# Patient Record
Sex: Female | Born: 1985 | Race: White | Hispanic: No | Marital: Single | Smoking: Never smoker
Health system: Southern US, Community
[De-identification: ages and names within clinical notes are randomized; demographics above are authoritative.]

## PROBLEM LIST (undated history)

## (undated) DIAGNOSIS — Z973 Presence of spectacles and contact lenses: Secondary | ICD-10-CM

## (undated) DIAGNOSIS — O1490 Unspecified pre-eclampsia, unspecified trimester: Secondary | ICD-10-CM

## (undated) DIAGNOSIS — O039 Complete or unspecified spontaneous abortion without complication: Secondary | ICD-10-CM

## (undated) DIAGNOSIS — O24419 Gestational diabetes mellitus in pregnancy, unspecified control: Secondary | ICD-10-CM

## (undated) HISTORY — DX: Complete or unspecified spontaneous abortion without complication: O03.9

## (undated) HISTORY — DX: Gestational diabetes mellitus in pregnancy, unspecified control: O24.419

## (undated) HISTORY — DX: Unspecified pre-eclampsia, unspecified trimester: O14.90

## (undated) HISTORY — PX: NO PAST SURGERIES: SHX2092

---

## 2010-02-27 ENCOUNTER — Observation Stay: Payer: Self-pay

## 2010-03-02 ENCOUNTER — Inpatient Hospital Stay: Payer: Self-pay

## 2020-08-28 DIAGNOSIS — L729 Follicular cyst of the skin and subcutaneous tissue, unspecified: Secondary | ICD-10-CM | POA: Diagnosis not present

## 2020-08-28 DIAGNOSIS — L089 Local infection of the skin and subcutaneous tissue, unspecified: Secondary | ICD-10-CM | POA: Diagnosis not present

## 2020-08-28 DIAGNOSIS — R030 Elevated blood-pressure reading, without diagnosis of hypertension: Secondary | ICD-10-CM | POA: Diagnosis not present

## 2020-09-18 DIAGNOSIS — L03221 Cellulitis of neck: Secondary | ICD-10-CM | POA: Diagnosis not present

## 2020-09-18 DIAGNOSIS — L723 Sebaceous cyst: Secondary | ICD-10-CM | POA: Diagnosis not present

## 2020-09-28 DIAGNOSIS — D489 Neoplasm of uncertain behavior, unspecified: Secondary | ICD-10-CM | POA: Diagnosis not present

## 2020-09-30 ENCOUNTER — Encounter: Payer: Self-pay | Admitting: Anesthesiology

## 2020-09-30 ENCOUNTER — Other Ambulatory Visit: Payer: Self-pay

## 2020-09-30 ENCOUNTER — Encounter: Payer: Self-pay | Admitting: Otolaryngology

## 2020-10-01 ENCOUNTER — Ambulatory Visit
Admission: EM | Admit: 2020-10-01 | Discharge: 2020-10-01 | Disposition: A | Discharge status: Home / Self Care | Payer: BC Managed Care – PPO | Attending: Physician Assistant | Admitting: Physician Assistant

## 2020-10-01 ENCOUNTER — Other Ambulatory Visit: Payer: Self-pay

## 2020-10-01 ENCOUNTER — Encounter: Payer: Self-pay | Admitting: Emergency Medicine

## 2020-10-01 DIAGNOSIS — R350 Frequency of micturition: Secondary | ICD-10-CM | POA: Diagnosis not present

## 2020-10-01 DIAGNOSIS — N3001 Acute cystitis with hematuria: Secondary | ICD-10-CM

## 2020-10-01 LAB — URINALYSIS, COMPLETE (UACMP) WITH MICROSCOPIC
Bilirubin Urine: NEGATIVE
Glucose, UA: NEGATIVE mg/dL
Ketones, ur: 5 mg/dL — AB
Nitrite: POSITIVE — AB
Protein, ur: 30 mg/dL — AB
Specific Gravity, Urine: 1.03 (ref 1.005–1.030)
pH: 6.5 (ref 5.0–8.0)

## 2020-10-01 MED ORDER — NITROFURANTOIN MONOHYD MACRO 100 MG PO CAPS: 100.0000 mg | ORAL_CAPSULE | Freq: Two times a day (BID) | ORAL | 0 refills | Status: AC

## 2020-10-01 NOTE — Discharge Instructions (Signed)

## 2020-10-01 NOTE — ED Triage Notes (Signed)
Patient in today c/o urinary frequency and urgency x 1 day. Patient denies fever or vaginal discharge. Patient did just find out that she is pregnant (LMP 09/02/20). Patient has not taken any OTC medications.

## 2020-10-01 NOTE — ED Provider Notes (Signed)
[facility]    CSN: [id number] Arrival date & time: 10/01/20  1113      History   Chief Complaint Chief Complaint  Patient presents with  . Urinary Urgency  . Urinary Frequency    HPI Darlene Moreno is a 34 y.o. female presenting for urinary frequency and urgency over the past day.  She denies any associated dysuria, vaginal discharge, abdominal/pelvic pain, back pain, hematuria, fever.  Patient states that she recently did have a vaginal yeast infection.  States that she took over-the-counter Monistat for symptoms.  Patient states that this came after she was on a lengthy course of doxycycline for a cyst of her neck.  She has an upcoming surgery for the cyst.  Patient states her last menstrual period was 1 month ago.  She recently had a positive pregnancy test this week.  No concern for STIs.  Patient has not taken any over-the-counter medications.  No other concerns today.  HPI  Past Medical History:  Diagnosis Date  . Wears contact lenses     There are no problems to display for this patient.   Past Surgical History:  Procedure Laterality Date  . NO PAST SURGERIES      OB History    Gravida  1   Para      Term      Preterm      AB      Living        SAB      IAB      Ectopic      Multiple      Live Births               Home Medications    Prior to Admission medications   Medication Sig Start Date End Date Taking? Authorizing Provider  nitrofurantoin, macrocrystal-monohydrate, (MACROBID) 100 MG capsule Take 1 capsule (100 mg total) by mouth 2 (two) times daily for 7 days. 10/01/20 10/08/20  Danton Clap, PA-C    Family History Family History  Problem Relation Age of Onset  . Hypertension Mother   . Diabetes Father     Social History Social History   Tobacco Use  . Smoking status: Never Smoker  . Smokeless tobacco: Never Used  Vaping Use  . Vaping Use: Never used  Substance Use Topics  . Alcohol use: Not  Currently  . Drug use: Never     Allergies   Amoxicillin   Review of Systems Review of Systems  Constitutional: Negative for chills and fever.  Gastrointestinal: Negative for abdominal pain, diarrhea, nausea and vomiting.  Genitourinary: Positive for frequency and urgency. Negative for decreased urine volume, dysuria, flank pain, hematuria, pelvic pain, vaginal bleeding, vaginal discharge and vaginal pain.  Musculoskeletal: Negative for back pain.  Skin: Negative for rash.     Physical Exam Triage Vital Signs ED Triage Vitals  Enc Vitals Group     BP 10/01/20 1125 126/85     Pulse Rate 10/01/20 1125 (!) 114     Resp 10/01/20 1125 18     Temp 10/01/20 1125 98.8 F (37.1 C)     Temp Source 10/01/20 1125 Oral     SpO2 10/01/20 1125 99 %     Weight 10/01/20 1126 160 lb (72.6 kg)     Height 10/01/20 1126 5\' 1"  (1.549 m)     Head Circumference --      Peak Flow --      Pain Score 10/01/20 1125 0  Pain Loc --      Pain Edu? --      Excl. in [place]? --    No data found.  Updated Vital Signs BP 126/85 (BP Location: Left Arm)   Pulse (!) 114   Temp 98.8 F (37.1 C) (Oral)   Resp 18   Ht 5\' 1"  (1.549 m)   Wt 160 lb (72.6 kg)   LMP 09/02/2020 (Exact Date)   SpO2 99%   BMI 30.23 kg/m    Physical Exam Vitals and nursing note reviewed.  Constitutional:      General: She is not in acute distress.    Appearance: Normal appearance. She is not ill-appearing or toxic-appearing.  HENT:     Head: Normocephalic and atraumatic.  Eyes:     General: No scleral icterus.       Right eye: No discharge.        Left eye: No discharge.     Conjunctiva/sclera: Conjunctivae normal.  Cardiovascular:     Rate and Rhythm: Regular rhythm. Tachycardia present.     Heart sounds: Normal heart sounds.  Pulmonary:     Effort: Pulmonary effort is normal. No respiratory distress.     Breath sounds: Normal breath sounds.  Abdominal:     Palpations: Abdomen is soft.     Tenderness: There  is no abdominal tenderness. There is no right CVA tenderness or left CVA tenderness.  Musculoskeletal:     Cervical back: Neck supple.  Skin:    General: Skin is dry.  Neurological:     General: No focal deficit present.     Mental Status: She is alert. Mental status is at baseline.     Motor: No weakness.     Gait: Gait normal.  Psychiatric:        Mood and Affect: Mood normal.        Behavior: Behavior normal.        Thought Content: Thought content normal.      UC Treatments / Results  Labs (all labs ordered are listed, but only abnormal results are displayed) Labs Reviewed  URINALYSIS, COMPLETE (UACMP) WITH MICROSCOPIC - Abnormal; Notable for the following components:      Result Value   APPearance HAZY (*)    Hgb urine dipstick SMALL (*)    Ketones, ur 5 (*)    Protein, ur 30 (*)    Nitrite POSITIVE (*)    Leukocytes,Ua TRACE (*)    Bacteria, UA FEW (*)    All other components within normal limits  URINE CULTURE    EKG   Radiology No results found.  Procedures Procedures (including critical care time)  Medications Ordered in UC Medications - No data to display  Initial Impression / Assessment and Plan / UC Course  I have reviewed the triage vital signs and the nursing notes.  Pertinent labs & imaging results that were available during my care of the patient were reviewed by me and considered in my medical decision making (see chart for details).   UA positive for small blood, positive nitrites, and trace leukocytes.  Urine will be sent for culture.  Treating for UTI at this time with Macrobid 7-day course that she is pregnant.  Advised increasing rest and fluids.  Advised supportive care.  Did offer to do a wet prep test for her to see if her yeast infection is resolved, but she declined stating that she does not have any symptoms and is not concerned.  Advised that she  develops a yeast infection to take over-the-counter Monistat.  Advised to follow-up as  needed.   Final Clinical Impressions(s) / UC Diagnoses   Final diagnoses:  Acute cystitis with hematuria  Urinary frequency     Discharge Instructions     UTI: Based on either symptoms or urinalysis, you may have a urinary tract infection. We will send the urine for culture and call with results in a few days. Begin antibiotics at this time. Your symptoms should be much improved over the next 2-3 days. Increase rest and fluid intake. If for some reason symptoms are worsening or not improving after a couple of days or the urine culture determines the antibiotics you are taking will not treat the infection, the antibiotics may be changed. Return or go to ER for fever, back pain, worsening urinary pain, discharge, increased blood in urine. May take Tylenol or Motrin OTC for pain relief or consider AZO if no contraindications     ED Prescriptions    Medication Sig Dispense Auth. Provider   nitrofurantoin, macrocrystal-monohydrate, (MACROBID) 100 MG capsule Take 1 capsule (100 mg total) by mouth 2 (two) times daily for 7 days. 14 capsule Danton Clap, PA-C     PDMP not reviewed this encounter.   Danton Clap, PA-C 10/01/20 1249

## 2020-10-04 LAB — URINE CULTURE: Culture: >=100000 — AB

## 2020-10-05 ENCOUNTER — Other Ambulatory Visit
Admission: RE | Admit: 2020-10-05 | Discharge: 2020-10-05 | Disposition: A | Discharge status: Home / Self Care | Payer: BC Managed Care – PPO | Source: Ambulatory Visit | Attending: Otolaryngology | Admitting: Otolaryngology

## 2020-10-05 ENCOUNTER — Other Ambulatory Visit: Payer: Self-pay

## 2020-10-05 DIAGNOSIS — Z20822 Contact with and (suspected) exposure to covid-19: Secondary | ICD-10-CM | POA: Insufficient documentation

## 2020-10-05 DIAGNOSIS — Z01812 Encounter for preprocedural laboratory examination: Secondary | ICD-10-CM | POA: Insufficient documentation

## 2020-10-05 DIAGNOSIS — Z881 Allergy status to other antibiotic agents status: Secondary | ICD-10-CM | POA: Diagnosis not present

## 2020-10-05 DIAGNOSIS — L720 Epidermal cyst: Secondary | ICD-10-CM | POA: Diagnosis not present

## 2020-10-05 LAB — SARS CORONAVIRUS 2 (TAT 6-24 HRS): SARS Coronavirus 2: NEGATIVE

## 2020-10-06 NOTE — Discharge Instructions (Signed)
General Anesthesia, Adult, Care After This sheet gives you information about how to care for yourself after your procedure. Your health care provider may also give you more specific instructions. If you have problems or questions, contact your health care provider. What can I expect after the procedure? After the procedure, the following side effects are common:  Pain or discomfort at the IV site.  Nausea.  Vomiting.  Sore throat.  Trouble concentrating.  Feeling cold or chills.  Weak or tired.  Sleepiness and fatigue.  Soreness and body aches. These side effects can affect parts of the body that were not involved in surgery. Follow these instructions at home:  For at least 24 hours after the procedure:  Have a responsible adult stay with you. It is important to have someone help care for you until you are awake and alert.  Rest as needed.  Do not: ? Participate in activities in which you could fall or become injured. ? Drive. ? Use heavy machinery. ? Drink alcohol. ? Take sleeping pills or medicines that cause drowsiness. ? Make important decisions or sign legal documents. ? Take care of children on your own. Eating and drinking  Follow any instructions from your health care provider about eating or drinking restrictions.  When you feel hungry, start by eating small amounts of foods that are soft and easy to digest (bland), such as toast. Gradually return to your regular diet.  Drink enough fluid to keep your urine pale yellow.  If you vomit, rehydrate by drinking water, juice, or clear broth. General instructions  If you have sleep apnea, surgery and certain medicines can increase your risk for breathing problems. Follow instructions from your health care provider about wearing your sleep device: ? Anytime you are sleeping, including during daytime naps. ? While taking prescription pain medicines, sleeping medicines, or medicines that make you drowsy.  Return to  your normal activities as told by your health care provider. Ask your health care provider what activities are safe for you.  Take over-the-counter and prescription medicines only as told by your health care provider.  If you smoke, do not smoke without supervision.  Keep all follow-up visits as told by your health care provider. This is important. Contact a health care provider if:  You have nausea or vomiting that does not get better with medicine.  You cannot eat or drink without vomiting.  You have pain that does not get better with medicine.  You are unable to pass urine.  You develop a skin rash.  You have a fever.  You have redness around your IV site that gets worse. Get help right away if:  You have difficulty breathing.  You have chest pain.  You have blood in your urine or stool, or you vomit blood. Summary  After the procedure, it is common to have a sore throat or nausea. It is also common to feel tired.  Have a responsible adult stay with you for the first 24 hours after general anesthesia. It is important to have someone help care for you until you are awake and alert.  When you feel hungry, start by eating small amounts of foods that are soft and easy to digest (bland), such as toast. Gradually return to your regular diet.  Drink enough fluid to keep your urine pale yellow.  Return to your normal activities as told by your health care provider. Ask your health care provider what activities are safe for you. This information is not   intended to replace advice given to you by your health care provider. Make sure you discuss any questions you have with your health care provider. Document Revised: 10/06/2017 Document Reviewed: 05/19/2017 Elsevier Patient Education  2020 Elsevier Inc.  

## 2020-10-07 ENCOUNTER — Encounter
Admission: RE | Disposition: A | Discharge status: Home / Self Care | Payer: Self-pay | Source: Home / Self Care | Attending: Otolaryngology

## 2020-10-07 ENCOUNTER — Other Ambulatory Visit: Payer: Self-pay

## 2020-10-07 ENCOUNTER — Ambulatory Visit
Admission: RE | Admit: 2020-10-07 | Discharge: 2020-10-07 | Disposition: A | Discharge status: Home / Self Care | Payer: BC Managed Care – PPO | Attending: Otolaryngology | Admitting: Otolaryngology

## 2020-10-07 ENCOUNTER — Encounter: Payer: Self-pay | Admitting: Otolaryngology

## 2020-10-07 DIAGNOSIS — Z881 Allergy status to other antibiotic agents status: Secondary | ICD-10-CM | POA: Diagnosis not present

## 2020-10-07 DIAGNOSIS — Z20822 Contact with and (suspected) exposure to covid-19: Secondary | ICD-10-CM | POA: Diagnosis not present

## 2020-10-07 DIAGNOSIS — L720 Epidermal cyst: Secondary | ICD-10-CM | POA: Diagnosis not present

## 2020-10-07 HISTORY — PX: EXCISION MASS NECK: SHX6703

## 2020-10-07 HISTORY — DX: Presence of spectacles and contact lenses: Z97.3

## 2020-10-07 SURGERY — EXCISION, MASS, NECK
Anesthesia: LOCAL | Site: Neck

## 2020-10-07 MED ORDER — LIDOCAINE-EPINEPHRINE 1 %-1:100000 IJ SOLN
INTRAMUSCULAR | Status: DC | PRN
  Administered 2020-10-07: 4.5 mL

## 2020-10-07 MED ORDER — BACITRACIN 500 UNIT/GM EX OINT
TOPICAL_OINTMENT | CUTANEOUS | Status: DC | PRN
  Administered 2020-10-07: 1 via TOPICAL

## 2020-10-07 SURGICAL SUPPLY — 10 items
CORD BIP STRL DISP 12FT (MISCELLANEOUS) ×1 IMPLANT
GLOVE BIO SURGEON STRL SZ7.5 (GLOVE) ×3 IMPLANT
GOWN STRL REUS W/ TWL LRG LVL3 (GOWN DISPOSABLE) ×2 IMPLANT
GOWN STRL REUS W/TWL LRG LVL3 (GOWN DISPOSABLE) ×4
KIT TURNOVER KIT A (KITS) ×2 IMPLANT
NS IRRIG 500ML POUR BTL (IV SOLUTION) ×2 IMPLANT
PACK HEAD/NECK (MISCELLANEOUS) ×2 IMPLANT
SOL PREP PROV IODINE SCRUB 4OZ (MISCELLANEOUS) ×1 IMPLANT
SUT PROLENE 5 0 PS 3 (SUTURE) ×1 IMPLANT
SUT VIC AB 5-0 PC1 18 (SUTURE) ×1 IMPLANT

## 2020-10-07 NOTE — Op Note (Signed)
..  10/07/2020  10:57 AM    Darlene Moreno  [id number]   Pre-Op Dx:  Epidermal Inclusion cyst  Post-op Dx: Epidermal Inclusion cyst  Proc: Right neck mass excision 3 cm  Surg: [facility]  Anes:  Local  EBL:  None  Comp:  None  Findings:  Right anterior neck mass excised consistent with ruptured epidermal inclusion cyst.  Procedure: With the patient in a comfortable supine position, local anesthesia of 85ml of 1% lidocaine with 1:100,000 epinephrine was administered.  The previously marked right neck mass was next prepped and draped in a sterile fashion.  A 15 blade scalpel was used to incision incise the dermis and this reveal erythematous tissue consistent with apparent ruptured epidermal inclusion cyst.  The cyst and surrounding tissue along with the cutaneous tissue was removed overlying the cyst.  At this time, the skin edges were elevated for skin closure and the sub dermis was re approximated with 5.0 Vicryl in an interrupted fashion.  The skin was closed with running prolene and then topped with bacitracin ointment.  Following this  The patient was transferred to recovery in stable condition.  Dispo:  PACU to home  Plan: Ointment TID x 7 days and then follow up in 1 week for suture removal.   Darlene Moreno 10:57 AM 10/07/2020

## 2020-10-07 NOTE — OR Nursing (Signed)
Pt in the room @ 1029  Pulse  98 O2 100 Resp 19 BP 114/77   Procedure end @ 1054  Pulse93 O2 99 Resp  17 BP 113/70    Monitor nurse T. Turner, RN, CNOR

## 2020-10-07 NOTE — H&P (Signed)
..  History and Physical paper copy reviewed and updated date of procedure and will be scanned into system.  Patient seen and examined and marked.  Patient pregnant so will do under local anesthesia.

## 2020-10-12 LAB — SURGICAL PATHOLOGY

## 2020-10-29 ENCOUNTER — Telehealth: Payer: Self-pay

## 2020-10-29 NOTE — Telephone Encounter (Signed)
Voice mail message left for patient- due to rise in [place] cases we have reinstated the no visitor policy.

## 2020-10-30 ENCOUNTER — Ambulatory Visit (INDEPENDENT_AMBULATORY_CARE_PROVIDER_SITE_OTHER): Payer: BC Managed Care – PPO | Admitting: Certified Nurse Midwife

## 2020-10-30 ENCOUNTER — Other Ambulatory Visit: Payer: Self-pay

## 2020-10-30 ENCOUNTER — Encounter: Payer: Self-pay | Admitting: Certified Nurse Midwife

## 2020-10-30 VITALS — BP 127/77 | HR 107 | Ht 61.0 in | Wt 161.1 lb

## 2020-10-30 DIAGNOSIS — N912 Amenorrhea, unspecified: Secondary | ICD-10-CM

## 2020-10-30 DIAGNOSIS — Z3201 Encounter for pregnancy test, result positive: Secondary | ICD-10-CM

## 2020-10-30 NOTE — Patient Instructions (Addendum)
WHAT OB PATIENTS CAN EXPECT   Confirmation of pregnancy and ultrasound ordered if medically indicated-[redacted] weeks gestation  New OB (NOB) intake with nurse and New OB (NOB) labs- [redacted] weeks gestation  New OB (NOB) physical examination with provider- 11/[redacted] weeks gestation  Flu vaccine-[redacted] weeks gestation  Anatomy scan-[redacted] weeks gestation  Glucose tolerance test, blood work to test for anemia, T-dap vaccine-[redacted] weeks gestation  Vaginal swabs/cultures-STD/Group B strep-[redacted] weeks gestation  Appointments every 4 weeks until 28 weeks  Every 2 weeks from 28 weeks until 36 weeks  Weekly visits from 36 weeks until delivery    Common Medications Safe in Pregnancy  Acne:      Constipation:  Benzoyl Peroxide     Colace  Clindamycin      Dulcolax Suppository  Topica Erythromycin     Fibercon  Salicylic Acid      Metamucil         Miralax AVOID:        Senakot   Accutane    Cough:  Retin-A       Cough Drops  Tetracycline      Phenergan w/ Codeine if Rx  Minocycline      Robitussin (Plain & DM)  Antibiotics:     Crabs/Lice:  Ceclor       RID  Cephalosporins    AVOID:  E-Mycins      Kwell  Keflex  Macrobid/Macrodantin   Diarrhea:  Penicillin      Kao-Pectate  Zithromax      Imodium AD         PUSH FLUIDS AVOID:       Cipro     Fever:  Tetracycline      Tylenol (Regular or Extra  Minocycline       Strength)  Levaquin      Extra Strength-Do not          Exceed 8 tabs/24 hrs Caffeine:        <298m/day (equiv. To 1 cup of coffee or  approx. 3 12 oz sodas)         Gas: Cold/Hayfever:       Gas-X  Benadryl      Mylicon  Claritin       Phazyme  **Claritin-D        Chlor-Trimeton    Headaches:  Dimetapp      ASA-Free Excedrin  Drixoral-Non-Drowsy     Cold Compress  Mucinex (Guaifenasin)     Tylenol (Regular or Extra  Sudafed/Sudafed-12 Hour     Strength)  **Sudafed PE Pseudoephedrine   Tylenol Cold & Sinus     Vicks Vapor Rub  Zyrtec  **AVOID if Problems With Blood  Pressure         Heartburn: Avoid lying down for at least 1 hour after meals  Aciphex      Maalox     Rash:  Milk of Magnesia     Benadryl    Mylanta       1% Hydrocortisone Cream  Pepcid  Pepcid Complete   Sleep Aids:  Prevacid      Ambien   Prilosec       Benadryl  Rolaids       Chamomile Tea  Tums (Limit 4/day)     Unisom         Tylenol PM         Warm milk-add vanilla or  Hemorrhoids:       Sugar for taste  Anusol/Anusol H.C.  (RX:  Analapram 2.5%)  Sugar Substitutes:  Hydrocortisone OTC     Ok in moderation  Preparation H      Tucks        Vaseline lotion applied to tissue with wiping    Herpes:     Throat:  Acyclovir      Oragel  Famvir  Valtrex     Vaccines:         Flu Shot Leg Cramps:       *Gardasil  Benadryl      Hepatitis A         Hepatitis B Nasal Spray:       Pneumovax  Saline Nasal Spray     Polio Booster         Tetanus Nausea:       Tuberculosis test or PPD  Vitamin B6 25 mg TID   AVOID:    Dramamine      *Gardasil  Emetrol       Live Poliovirus  Ginger Root 250 mg QID    MMR (measles, mumps &  High Complex Carbs @ Bedtime    rebella)  Sea Bands-Accupressure    Varicella (Chickenpox)  Unisom 1/2 tab TID     *No known complications           If received before Pain:         Known pregnancy;   Darvocet       Resume series after  Lortab        Delivery  Percocet    Yeast:   Tramadol      Femstat  Tylenol 3      Gyne-lotrimin  Ultram       Monistat  Vicodin           MISC:         All Sunscreens           Hair Coloring/highlights          Insect Repellant's          (Including DEET)         Mystic Tans   https://www.cdc.gov/pregnancy/infections.html">  First Trimester of Pregnancy  The first trimester of pregnancy starts on the first day of your last menstrual period until the end of week 12. This is also called months 1 through 3 of pregnancy. Body changes during your first trimester Your body goes through many changes during  pregnancy. The changes usually return to normal after your baby is born. Physical changes  You may gain or lose weight.  Your breasts may grow larger and hurt. The area around your nipples may get darker.  Dark spots or blotches may develop on your face.  You may have changes in your hair. Health changes  You may feel like you might vomit (nauseous), and you may vomit.  You may have heartburn.  You may have headaches.  You may have trouble pooping (constipation).  Your gums may bleed. Other changes  You may get tired easily.  You may pee (urinate) more often.  Your menstrual periods will stop.  You may not feel hungry.  You may want to eat certain kinds of food.  You may have changes in your emotions from day to day.  You may have more dreams. Follow these instructions at home: Medicines  Take over-the-counter and prescription medicines only as told by your doctor. Some medicines are not safe during pregnancy.  Take a prenatal vitamin that contains at least 600 micrograms (mcg) of folic acid.   Eating and drinking  Eat healthy meals that include: ? Fresh fruits and vegetables. ? Whole grains. ? Good sources of protein, such as meat, eggs, or tofu. ? Low-fat dairy products.  Avoid raw meat and unpasteurized juice, milk, and cheese.  If you feel like you may vomit, or you vomit: ? Eat 4 or 5 small meals a day instead of 3 large meals. ? Try eating a few soda crackers. ? Drink liquids between meals instead of during meals.  You may need to take these actions to prevent or treat trouble pooping: ? Drink enough fluids to keep your pee (urine) pale yellow. ? Eat foods that are high in fiber. These include beans, whole grains, and fresh fruits and vegetables. ? Limit foods that are high in fat and sugar. These include fried or sweet foods. Activity  Exercise only as told by your doctor. Most people can do their usual exercise routine during pregnancy.  Stop  exercising if you have cramps or pain in your lower belly (abdomen) or low back.  Do not exercise if it is too hot or too humid, or if you are in a place of great height (high altitude).  Avoid heavy lifting.  If you choose to, you may have sex unless your doctor tells you not to. Relieving pain and discomfort  Wear a good support bra if your breasts are sore.  Rest with your legs raised (elevated) if you have leg cramps or low back pain.  If you have bulging veins (varicose veins) in your legs: ? Wear support hose as told by your doctor. ? Raise your feet for 15 minutes, 3-4 times a day. ? Limit salt in your food. Safety  Wear your seat belt at all times when you are in a car.  Talk with your doctor if someone is hurting you or yelling at you.  Talk with your doctor if you are feeling sad or have thoughts of hurting yourself. Lifestyle  Do not use hot tubs, steam rooms, or saunas.  Do not douche. Do not use tampons or scented sanitary pads.  Do not use herbal medicines, illegal drugs, or medicines that are not approved by your doctor. Do not drink alcohol.  Do not smoke or use any products that contain nicotine or tobacco. If you need help quitting, ask your doctor.  Avoid cat litter boxes and soil that is used by cats. These carry germs that can cause harm to the baby and can cause a loss of your baby by miscarriage or stillbirth. General instructions  Keep all follow-up visits. This is important.  Ask for help if you need counseling or if you need help with nutrition. Your doctor can give you advice or tell you where to go for help.  Visit your dentist. At home, brush your teeth with a soft toothbrush. Floss gently.  Write down your questions. Take them to your prenatal visits. Where to find more information  American Pregnancy Association: americanpregnancy.org  American College of Obstetricians and Gynecologists: www.acog.org  Office on Women's Health:  womenshealth.gov/pregnancy Contact a doctor if:  You are dizzy.  You have a fever.  You have mild cramps or pressure in your lower belly.  You have a nagging pain in your belly area.  You continue to feel like you may vomit, you vomit, or you have watery poop (diarrhea) for 24 hours or longer.  You have a bad-smelling fluid coming from your vagina.  You have pain when you pee.  You   are exposed to a disease that spreads from person to person, such as chickenpox, measles, Zika virus, HIV, or hepatitis. Get help right away if:  You have spotting or bleeding from your vagina.  You have very bad belly cramping or pain.  You have shortness of breath or chest pain.  You have any kind of injury, such as from a fall or a car crash.  You have new or increased pain, swelling, or redness in an arm or leg. Summary  The first trimester of pregnancy starts on the first day of your last menstrual period until the end of week 12 (months 1 through 3).  Eat 4 or 5 small meals a day instead of 3 large meals.  Do not smoke or use any products that contain nicotine or tobacco. If you need help quitting, ask your doctor.  Keep all follow-up visits. This information is not intended to replace advice given to you by your health care provider. Make sure you discuss any questions you have with your health care provider. Document Revised: 03/11/2020 Document Reviewed: 01/16/2020 Elsevier Patient Education  2021 [employer]vier Inc.  

## 2020-10-31 NOTE — Progress Notes (Signed)
GYN ENCOUNTER NOTE  Subjective:       Darlene Moreno is a 35 y.o. [id number] female here for pregnancy confirmation.   Endorses fatigue, breast tenderness, and nausea without vomiting.   Denies difficulty breathing or respiratory distress, chest pain, dysuria, and leg pain or swelling.     Gynecologic History  Patient's last menstrual period was 09/02/2020 (exact date).   Gestational age: 25 weeks 2 days  Estimated date of birth: 06/09/2019.  Contraception: none   Obstetric History   OB History  Gravida Para Term Preterm AB Living  2 1 1     1   SAB IAB Ectopic Multiple Live Births          1    # Outcome Date GA Lbr Len/2nd Weight Sex Delivery Anes PTL Lv  2 Current           1 Term 03/02/10   7 lb 2 oz (3.232 kg) M Vag-Spont EPI N LIV    Past Medical History:  Diagnosis Date  . Wears contact lenses     Past Surgical History:  Procedure Laterality Date  . EXCISION MASS NECK N/A 10/07/2020   Procedure: EXCISION  NECK EPIDERMAL INCLUSION CYST;  Surgeon: Carloyn Manner, MD;  Location: [facility];  Service: ENT;  Laterality: N/A;  Local  . NO PAST SURGERIES      Current Outpatient Medications on File Prior to Visit  Medication Sig Dispense Refill  . Prenatal Vit-Fe Fumarate-FA (MULTIVITAMIN-PRENATAL) 27-0.8 MG TABS tablet Take 1 tablet by mouth daily at 12 noon.     No current facility-administered medications on file prior to visit.    Allergies  Allergen Reactions  . Amoxicillin Rash    Social History   Socioeconomic History  . Marital status: Single    Spouse name: Not on file  . Number of children: Not on file  . Years of education: Not on file  . Highest education level: Not on file  Occupational History  . Not on file  Tobacco Use  . Smoking status: Never Smoker  . Smokeless tobacco: Never Used  Vaping Use  . Vaping Use: Never used  Substance and Sexual Activity  . Alcohol use: Not Currently  . Drug use: Never  . Sexual activity:  Not on file  Other Topics Concern  . Not on file  Social History Narrative  . Not on file   Social Determinants of Health   Financial Resource Strain: Not on file  Food Insecurity: Not on file  Transportation Needs: Not on file  Physical Activity: Not on file  Stress: Not on file  Social Connections: Not on file  Intimate Partner Violence: Not on file    Family History  Problem Relation Age of Onset  . Hypertension Mother   . Diabetes Father     The following portions of the patient's history were reviewed and updated as appropriate: allergies, current medications, past family history, past medical history, past social history, past surgical history and problem list.  Review of Systems  ROS negative except as noted above. Information obtained from patient.   Objective:   BP 127/77   Pulse (!) 107   Ht 5\' 1"  (1.549 m)   Wt 161 lb 1.6 oz (73.1 kg)   LMP 09/02/2020 (Exact Date)   BMI 30.44 kg/m    CONSTITUTIONAL: Well-developed, well-nourished female in no acute distress.   PHYSICAL EXAM: Not indicated.   Recent Results (from the past 2160 hour(s))  Urinalysis, Complete  w Microscopic     Status: Abnormal   Collection Time: 10/01/20 11:29 AM  Result Value Ref Range   Color, Urine YELLOW YELLOW   APPearance HAZY (A) CLEAR   Specific Gravity, Urine 1.030 1.005 - 1.030   pH 6.5 5.0 - 8.0   Glucose, UA NEGATIVE NEGATIVE mg/dL   Hgb urine dipstick SMALL (A) NEGATIVE   Bilirubin Urine NEGATIVE NEGATIVE   Ketones, ur 5 (A) NEGATIVE mg/dL   Protein, ur 30 (A) NEGATIVE mg/dL   Nitrite POSITIVE (A) NEGATIVE   Leukocytes,Ua TRACE (A) NEGATIVE   Squamous Epithelial / LPF 6-10 0 - 5   WBC, UA 6-10 0 - 5 WBC/hpf   RBC / HPF 11-20 0 - 5 RBC/hpf   Bacteria, UA FEW (A) NONE SEEN    Comment: Performed at [facility] [place], [address], [place], [place] [address]  Urine culture     Status: Abnormal   Collection Time: 10/01/20 11:29 AM   Specimen: Urine, Random   Result Value Ref Range   Specimen Description      URINE, RANDOM Performed at [facility], [address], [place], [place] [address]    Special Requests      NONE Performed at [facility], [address], [place], [place] [address]    Culture (A)     >=100,000 COLONIES/mL ESCHERICHIA COLI 80,000 COLONIES/mL STREPTOCOCCUS AGALACTIAE TESTING AGAINST S. AGALACTIAE NOT ROUTINELY PERFORMED DUE TO PREDICTABILITY OF AMP/PEN/VAN SUSCEPTIBILITY. Performed at [facility], [place] [address] [place], [place] [address]    Report Status 10/04/2020 FINAL    Organism ID, Bacteria ESCHERICHIA COLI (A)       Susceptibility   Escherichia coli - MIC*    AMPICILLIN >=32 RESISTANT Resistant     CEFAZOLIN <=4 SENSITIVE Sensitive     CEFEPIME <=0.12 SENSITIVE Sensitive     CEFTRIAXONE <=0.25 SENSITIVE Sensitive     CIPROFLOXACIN <=0.25 SENSITIVE Sensitive     GENTAMICIN <=1 SENSITIVE Sensitive     IMIPENEM <=0.25 SENSITIVE Sensitive     NITROFURANTOIN <=16 SENSITIVE Sensitive     TRIMETH/SULFA <=20 SENSITIVE Sensitive     AMPICILLIN/SULBACTAM 16 INTERMEDIATE Intermediate     PIP/TAZO <=4 SENSITIVE Sensitive     * >=100,000 COLONIES/mL ESCHERICHIA COLI  SARS CORONAVIRUS 2 (TAT 6-24 HRS) Nasopharyngeal Nasopharyngeal Swab     Status: None   Collection Time: 10/05/20 12:53 PM   Specimen: Nasopharyngeal Swab  Result Value Ref Range   SARS Coronavirus 2 NEGATIVE NEGATIVE    Comment: (NOTE) SARS-CoV-2 target nucleic acids are NOT DETECTED.  The SARS-CoV-2 RNA is generally detectable in upper and lower respiratory specimens during the acute phase of infection. Negative results do not preclude SARS-CoV-2 infection, do not rule out co-infections with other pathogens, and should not be used as the sole basis for treatment or other patient management decisions. Negative results must be combined with clinical observations, patient history, and epidemiological information.  The expected result is Negative.  Fact Sheet for Patients: SugarRoll.be  Fact Sheet for Healthcare Providers: https://www.woods-mathews.com/  This test is not yet approved or cleared by the [place] FDA and  has been authorized for detection and/or diagnosis of SARS-CoV-2 by FDA under an Emergency Use Authorization (EUA). This EUA will remain  in effect (meaning this test can be used) for the duration of the COVID-19 declaration under Se ction 564(b)(1) of the Act, 21 U.S.C. section 360bbb-3(b)(1), unless the authorization is terminated or revoked sooner.  Performed at [facility], [place] [address], [place], [place] [address]   Surgical pathology     Status: None   Collection Time: 10/07/20 10:36 AM  Result Value Ref Range   SURGICAL PATHOLOGY      SURGICAL PATHOLOGY CASE: [phone] PATIENT: Linnell Fulling Surgical Pathology Report     Specimen Submitted: A. Right neck mass  Clinical History: Epidermal inclusion cyst      DIAGNOSIS: A. NECK MASS, RIGHT; EXCISION: - SKIN AND SUBCUTANEOUS TISSUE WITH FOREIGN BODY GIANT CELL REACTION, COMPATIBLE WITH RUPTURED CYST / FOLLICLE. - NEGATIVE FOR MALIGNANCY.   GROSS DESCRIPTION: A. Labeled: Right neck mass Received: Formalin Tissue fragment(s): 1 Size: 2.4 x 0.7 cm excised to a depth of 0.5 cm Description: Received is an ellipse of tan finely lobulated skin.  The specimen is unoriented and entirely inked blue.  Sectioning reveals a central focally disrupted potential cyst, 0.7 x 0.5 x 0.4 cm.  No additional distinct masses or lesions are identified. Entirely submitted in cassettes 1-2 with the ellipse tips in cassette 1 (red ink for orientation at embedding) and the remaining central sections in cassette 2.     Final Diagnosis performed by Betsy Pries, MD.   Electronically signed 10/12/2020 9:22:40AM The electronic signature indicates that the named  Attending Pathologist has evaluated the specimen Technical component performed at [facility], [address] [place], [place] [address] Lab: [phone] Dir: Rush Farmer, MD, MMM  Professional component performed at [facility], [facility], [place], [place], [place] [address] Lab: [phone] Dir: Dellia Nims. Rubinas, MD   POCT urine pregnancy     Status: Abnormal   Collection Time: 11/03/20 10:47 AM  Result Value Ref Range   Preg Test, Ur Positive (A) Negative     Assessment:   1. Amenorrhea  - POCT urine pregnancy - US OB LESS THAN 14 WEEKS WITH OB TRANSVAGINAL; Future  2. Positive pregnancy test  - US OB LESS THAN 14 WEEKS WITH OB TRANSVAGINAL; Future     Plan:   First trimester education, see AVS.   Reviewed red flag symptoms and when to call.   RTC x 1-2 wks for dating/viability ultrasound & intake.  RTC x 4-5 wks for NOB PE or sooner if needed.   Dani Gobble, CNM [facility], [facility]

## 2020-11-03 LAB — POCT URINE PREGNANCY: Preg Test, Ur: POSITIVE — AB

## 2020-11-05 ENCOUNTER — Ambulatory Visit (INDEPENDENT_AMBULATORY_CARE_PROVIDER_SITE_OTHER): Payer: BC Managed Care – PPO | Admitting: Certified Nurse Midwife

## 2020-11-05 ENCOUNTER — Encounter: Payer: Self-pay | Admitting: Obstetrics and Gynecology

## 2020-11-05 ENCOUNTER — Other Ambulatory Visit: Payer: Self-pay

## 2020-11-05 DIAGNOSIS — R300 Dysuria: Secondary | ICD-10-CM

## 2020-11-05 DIAGNOSIS — R3915 Urgency of urination: Secondary | ICD-10-CM

## 2020-11-05 LAB — POCT URINALYSIS DIPSTICK
Bilirubin, UA: NEGATIVE
Glucose, UA: NEGATIVE
Ketones, UA: NEGATIVE
Nitrite, UA: NEGATIVE
Protein, UA: POSITIVE — AB
Spec Grav, UA: 1.01 (ref 1.010–1.025)
Urobilinogen, UA: 0.2 E.U./dL
pH, UA: 5 (ref 5.0–8.0)

## 2020-11-05 MED ORDER — NITROFURANTOIN MONOHYD MACRO 100 MG PO CAPS: 100.0000 mg | ORAL_CAPSULE | Freq: Two times a day (BID) | ORAL | 0 refills | Status: DC

## 2020-11-05 NOTE — Progress Notes (Signed)
I have reviewed the record and concur with patient management and plan of care. Rx Macrobid, see orders.    Dani Gobble, CNM [facility], [facility] 11/05/20 9:19 AM

## 2020-11-05 NOTE — Patient Instructions (Signed)
Nitrofurantoin tablets or capsules What is this medicine? NITROFURANTOIN (nye troe fyoor AN toyn) is an antibiotic. It is used to treat urinary tract infections. This medicine may be used for other purposes; ask your health care provider or pharmacist if you have questions. COMMON BRAND NAME(S): Macrobid, Macrodantin, Urotoin What should I tell my health care provider before I take this medicine? They need to know if you have any of these conditions:  anemia  diabetes  glucose-6-phosphate dehydrogenase deficiency  kidney disease  liver disease  lung disease  other chronic illness  an unusual or allergic reaction to nitrofurantoin, other antibiotics, other medicines, foods, dyes or preservatives  pregnant or trying to get pregnant  breast-feeding How should I use this medicine? Take this medicine by mouth with a glass of water. Follow the directions on the prescription label. Take this medicine with food or milk. Take your doses at regular intervals. Do not take your medicine more often than directed. Do not stop taking except on your doctor's advice. Talk to your pediatrician regarding the use of this medicine in children. While this drug may be prescribed for selected conditions, precautions do apply. Overdosage: If you think you have taken too much of this medicine contact a poison control center or emergency room at once. NOTE: This medicine is only for you. Do not share this medicine with others. What if I miss a dose? If you miss a dose, take it as soon as you can. If it is almost time for your next dose, take only that dose. Do not take double or extra doses. What may interact with this medicine?  antacids containing magnesium trisilicate  probenecid  quinolone antibiotics like ciprofloxacin, lomefloxacin, norfloxacin and ofloxacin  sulfinpyrazone This list may not describe all possible interactions. Give your health care provider a list of all the medicines, herbs,  non-prescription drugs, or dietary supplements you use. Also tell them if you smoke, drink alcohol, or use illegal drugs. Some items may interact with your medicine. What should I watch for while using this medicine? Tell your doctor or health care professional if your symptoms do not improve or if you get new symptoms. Drink several glasses of water a day. If you are taking this medicine for a long time, visit your doctor for regular checks on your progress. If you are diabetic, you may get a false positive result for sugar in your urine with certain brands of urine tests. Check with your doctor. What side effects may I notice from receiving this medicine? Side effects that you should report to your doctor or health care professional as soon as possible:  allergic reactions like skin rash or hives, swelling of the face, lips, or tongue  chest pain  cough  difficulty breathing  dizziness, drowsiness  fever or infection  joint aches or pains  pale or blue-tinted skin  redness, blistering, peeling or loosening of the skin, including inside the mouth  tingling, burning, pain, or numbness in hands or feet  unusual bleeding or bruising  unusually weak or tired  yellowing of eyes or skin Side effects that usually do not require medical attention (report to your doctor or health care professional if they continue or are bothersome):  dark urine  diarrhea  headache  loss of appetite  nausea or vomiting  temporary hair loss This list may not describe all possible side effects. Call your doctor for medical advice about side effects. You may report side effects to FDA at 1-800-FDA-1088. Where should  I keep my medicine? Keep out of the reach of children. Store at room temperature between 15 and 30 degrees C (59 and 86 degrees F). Protect from light. Throw away any unused medicine after the expiration date. NOTE: This sheet is a summary. It may not cover all possible information.  If you have questions about this medicine, talk to your doctor, pharmacist, or health care provider.  2021 Elsevier/Gold Standard (2008-04-23 15:56:47)   Dysuria Dysuria is pain or discomfort during urination. The pain or discomfort may be felt in the part of the body that drains urine from the bladder (urethra) or in the surrounding tissue of the genitals. The pain may also be felt in the groin area, lower abdomen, or lower back. You may have to urinate frequently or have the sudden feeling that you have to urinate (urgency). Dysuria can affect anyone, but it is more common in females. Dysuria can be caused by many different things, including:  Urinary tract infection.  Kidney stones or bladder stones.  Certain STIs (sexually transmitted infections), such as chlamydia.  Dehydration.  Inflammation of the tissues of the vagina.  Use of certain medicines.  Use of certain soaps or scented products that cause irritation. Follow these instructions at home: Medicines  Take over-the-counter and prescription medicines only as told by your health care provider.  If you were prescribed an antibiotic medicine, take it as told by your health care provider. Do not stop taking the antibiotic even if you start to feel better. Eating and drinking  Drink enough fluid to keep your urine pale yellow.  Avoid caffeinated beverages, tea, and alcohol. These beverages can irritate the bladder and make dysuria worse. In males, alcohol may irritate the prostate.   General instructions  Watch your condition for any changes.  Urinate often. Avoid holding urine for long periods of time.  If you are female, you should wipe from front to back after urinating or having a bowel movement. Use each piece of toilet paper only once.  Empty your bladder after sex.  Keep all follow-up visits. This is important.  If you had any tests done to find the cause of dysuria, it is up to you to get your test results.  Ask your health care provider, or the department that is doing the test, when your results will be ready. Contact a health care provider if:  You have a fever.  You develop pain in your back or sides.  You have nausea or vomiting.  You have blood in your urine.  You are not urinating as often as you usually do. Get help right away if:  Your pain is severe and not relieved with medicines.  You cannot eat or drink without vomiting.  You are confused.  You have a rapid heartbeat while resting.  You have shaking or chills.  You feel extremely weak. Summary  Dysuria is pain or discomfort while urinating. Many different conditions can lead to dysuria.  If you have dysuria, you may have to urinate frequently or have the sudden feeling that you have to urinate (urgency).  Watch your condition for any changes. Keep all follow-up visits.  Make sure that you urinate often and drink enough fluid to keep your urine pale yellow. This information is not intended to replace advice given to you by your health care provider. Make sure you discuss any questions you have with your health care provider. Document Revised: 05/15/2020 Document Reviewed: 05/15/2020 Elsevier Patient Education  [place].

## 2020-11-05 NOTE — Progress Notes (Signed)
Patient presents to office for a urine drop off. Patient states she has urgency and some dysuria. Urine dipped and sent for cultures. Reviewed allergies- amoxicillin and confirmed patients pharmacy- [facility] in [place].

## 2020-11-09 ENCOUNTER — Encounter: Payer: Self-pay | Admitting: Certified Nurse Midwife

## 2020-11-09 DIAGNOSIS — N390 Urinary tract infection, site not specified: Secondary | ICD-10-CM | POA: Insufficient documentation

## 2020-11-09 DIAGNOSIS — B9620 Unspecified Escherichia coli [E. coli] as the cause of diseases classified elsewhere: Secondary | ICD-10-CM | POA: Insufficient documentation

## 2020-11-09 LAB — URINE CULTURE

## 2020-11-11 ENCOUNTER — Ambulatory Visit (INDEPENDENT_AMBULATORY_CARE_PROVIDER_SITE_OTHER): Payer: BC Managed Care – PPO

## 2020-11-11 ENCOUNTER — Other Ambulatory Visit: Payer: Self-pay

## 2020-11-11 DIAGNOSIS — N912 Amenorrhea, unspecified: Secondary | ICD-10-CM

## 2020-11-11 DIAGNOSIS — Z3201 Encounter for pregnancy test, result positive: Secondary | ICD-10-CM | POA: Diagnosis not present

## 2020-11-20 ENCOUNTER — Other Ambulatory Visit: Payer: Self-pay

## 2020-11-20 ENCOUNTER — Ambulatory Visit (INDEPENDENT_AMBULATORY_CARE_PROVIDER_SITE_OTHER): Payer: BC Managed Care – PPO | Admitting: Certified Nurse Midwife

## 2020-11-20 VITALS — BP 106/72 | HR 90 | Ht 61.0 in | Wt 164.1 lb

## 2020-11-20 DIAGNOSIS — Z113 Encounter for screening for infections with a predominantly sexual mode of transmission: Secondary | ICD-10-CM | POA: Diagnosis not present

## 2020-11-20 DIAGNOSIS — R638 Other symptoms and signs concerning food and fluid intake: Secondary | ICD-10-CM | POA: Diagnosis not present

## 2020-11-20 DIAGNOSIS — Z3481 Encounter for supervision of other normal pregnancy, first trimester: Secondary | ICD-10-CM | POA: Diagnosis not present

## 2020-11-20 DIAGNOSIS — Z0283 Encounter for blood-alcohol and blood-drug test: Secondary | ICD-10-CM

## 2020-11-20 NOTE — Patient Instructions (Signed)
[phone] and Pregnancy  Pregnant and recently pregnant women should take steps to stay healthy, including . getting a COVID-19 vaccine  . following guidelines from health officials for when to wear a mask and take other steps to prevent infection . keeping your prenatal and postpartum care visits . talking with an OB-GYN or other health care professional if you have any questions about your health or COVID-19 . calling 911 or going to the hospital right away if you need emergency health care   If you think you may have been exposed to the coronavirus and have a fever or cough, call your ob-gyn or other health care professional for advice. If you have emergency warning signs, call 911 or go to the hospital right away. Emergency warning signs include the following: . Having a hard time breathing or shortness of breath (more than what has been normal for you during pregnancy) . Ongoing pain or pressure in the chest . Sudden confusion . Being unable to respond to others . Blue lips or face . Decreased fetal movement/absent of fetal movement . Fever greater than 100.4  If you go to the hospital, try to call ahead to let them know you are coming so they can prepare. If you have other symptoms that worry you, call your OB-GYN or 911.   If you are diagnosed with COVID-19, follow the advice from the [facility] and your ob-gyn or other health care professional. The current CDC advice for all people with COVID-19 includes the following: . Stay home except to get medical care. Avoid public transportation. Marland Kitchen Speak with your health care team over the phone before going to their office. Get medical care right away if you feel worse or think it's an emergency. . Separate yourself from other people in your home. . Wear a face mask when you are around other people and when you go to get medical care. . Use the safe medication list to treat the symptoms i.e., cough, congestion, sore throat, fever. . If you are  having nausea and are unable to hold down liquids or food contact the office as we can prescribe medication. . Stay hydrate. Frequent sips of water, broth, ice chips, and popsicle. . Small bland meals.  . Hand hygiene- Wash hands frequently and/or use hand sanitizer.  . Wipe down surfaces.    For additional information please visit the web site below.    [email or web address]   Patient Education: Lab Results  Most normal laboratory results are generally published to "My Chart Patient Portal". Abnormal labs will always be called or, in some cases, you may receive a letter. Not all labs are able to generate results quickly, but those that do must have your provider review the labs and come up with a plan of care before you can be notified. Calling on the phone will not speed up this process. While we do a number of lab tests in-house, those that have to be sent to an outside reference lab generally take longer. Certain tests, such as HPV typing from a pap smear, can take up to three weeks for results to be ready. We notify you when we have the results of all your lab tests, and your provider has reviewed and signed off on them.      WHAT OB PATIENTS CAN EXPECT   Confirmation of pregnancy and ultrasound ordered if medically indicated-[redacted] weeks gestation  New OB (NOB) intake with nurse and New OB (NOB) labs- [redacted] weeks gestation  New OB (  NOB) physical examination with provider- 11/[redacted] weeks gestation  Flu vaccine-[redacted] weeks gestation  Anatomy scan-[redacted] weeks gestation  Glucose tolerance test, blood work to test for anemia, T-dap vaccine-[redacted] weeks gestation  Vaginal swabs/cultures-STD/Group B strep-[redacted] weeks gestation  Appointments every 4 weeks until 28 weeks  Every 2 weeks from 28 weeks until 36 weeks  Weekly visits from 36 weeks until delivery  Tests and Screening During Pregnancy Having certain tests and screenings during pregnancy is an important part of your prenatal care. These  tests help your health care provider find problems that might affect your pregnancy. Some tests must be done for all pregnant women, and some are optional. Most of the tests and screenings do not pose any risks for you or your baby. You may need additional testing if any routine tests indicate a problem. Tests and screenings done early in pregnancy Some tests and screenings you can expect to have in early pregnancy include:  Blood tests, such as: ? Complete blood count (CBC). This test is done to check your red and white blood cells. It can help identify a risk for anemia, infection, or bleeding. ? Blood typing. This test shows your blood type. It also shows whether you have a certain protein in your red blood cells called the Rh factor. It can be dangerous for your baby if you do not have this protein (Rh negative) and your baby has it (Rh positive). ? Tests to check for diseases that can cause birth defects or can be passed to your baby, such as:  [place] measles (rubella) and chicken pox. The test indicates whether you are immune to these diseases.  Hepatitis B and C.  Human Immunodeficiency Virus (HIV).  Syphilis.  Zika virus, if you or your partner has traveled to an area where the virus occurs.  Urine testing. This checks for sugar in your urine and for signs of infection.  Blood pressure. This is to check for high blood pressure and preeclampsia.  Testing for sexually transmitted infections (STIs), such as chlamydia or gonorrhea.  Testing for tuberculosis. You may have this skin test if you are at risk for tuberculosis.  Fetal ultrasound. This is an imaging study of your growing baby. It uses sound waves to create pictures of your baby. This test may be done to help determine your due date and to ensure you do not have an ectopic pregnancy. An ectopic pregnancy is a pregnancy that grows outside of the uterus.   Tests and screenings done later in pregnancy Certain tests are done for  the first time later in the pregnancy. Some of the tests that were done in early pregnancy are repeated at this time. Some common tests you can expect to have later in pregnancy include:  Rh antibody testing. If you are Rh negative, you will have a blood test at about 28 weeks of pregnancy to see if you are producing Rh antibodies. If you have not started to make antibodies, you will be given an injection to prevent you from making antibodies for the rest of your pregnancy.  Glucose screening. This checks your blood sugar. It will show whether you are developing the type of diabetes that occurs during pregnancy (gestational diabetes). You may have this screening earlier if you have risk factors for diabetes.  Screening for group B streptococcus (GBS). GBS is a type of bacteria that may live in your rectum or vagina. GBS can spread to your baby during birth. This is done at 35-37 weeks  of pregnancy. If testing is positive for GBS, you may be treated with antibiotic medicine.  Urine and blood tests to monitor for other pregnancy problems, such as preeclampsia or anemia.  Blood pressure to monitor for high blood pressure and preeclampsia.  Fetal ultrasound. This may be repeated at 16-20 weeks to check how your baby is growing and developing.  Non-stress test. This test is done later in pregnancy to check your baby's heart rate. This may be repeated weekly if your pregnancy is high risk.  Biophysical profile. This test includes ultrasound imaging and a non-stress test to ensure your baby is healthy. This test may help decide when your baby should be born.   Screening for birth defects Some birth defects are caused by abnormal genes passed down through families. Early in your pregnancy, tests can be done to find out if your baby is at risk for a genetic disorder. This testing is optional. The type of testing recommended for you will depend on your family and medical history, your ethnicity, and your  age. Testing may include:  Screening tests. These tests may include an ultrasound, blood tests, or a combination of both. The blood tests are used to check for abnormal genes and the ultrasound is done to look for early birth defects.  Carrier screening. This test involves checking the blood or saliva of both parents to see if they carry abnormal genes that could be passed down to a baby. If genetic screening shows that your baby is at risk for a genetic defect, additional diagnostic testing may be recommended, such as:  Amniocentesis. This involves testing a sample of fluid from your womb (amniotic fluid).  Chorionic villus sampling. In this test, a sample of cells from your placenta is checked for abnormal cells. Unlike other tests done during pregnancy, diagnostic testing does have some risk for your pregnancy. Talk to your health care provider about the risks and benefits of genetic testing. Questions to ask your health care provider  What routine tests are recommended for me?  When and how will these tests be done?  When will I get the results of routine tests?  What do the results of these tests mean for me or my baby?  Do you recommend any genetic screening tests? Which ones?  Should I see a genetic counselor before having genetic screening? Where to find more information  American Pregnancy Association: americanpregnancy.org/prenatal-testing  [employer] of Obstetricians and Gynecologists: JewelryExec.com.pt  Office on [employer]: KeywordPortfolios.com.br  March of Dimes: marchofdimes.org/pregnancy Summary  Having certain tests and screenings during pregnancy is an important part of your prenatal care. Talk to your health care provider about what tests are right for you and your baby.  In early pregnancy, testing may be done to check your risks for various conditions that can affect you and your baby.  Later in pregnancy, tests may be done to ensure that your baby  is growing normally and that you and your baby are staying healthy during the pregnancy.  Genetic testing is optional. Talk to your health care provider about the risks and benefits of genetic testing. This information is not intended to replace advice given to you by your health care provider. Make sure you discuss any questions you have with your health care provider. Document Revised: 06/23/2020 Document Reviewed: 06/23/2020 Elsevier Patient Education  2021 [place]. https://www.acog.org/womens-health/faqs/prenatal-genetic-screening-tests">  Prenatal Care Prenatal care is health care during pregnancy. It helps you and your unborn baby (fetus) stay as healthy as  possible. Prenatal care may be provided by a midwife, a family practice doctor, a IT consultant (nurse practitioner or physician assistant), or a childbirth and pregnancy doctor (obstetrician). How does this affect me? During pregnancy, you will be closely monitored for any new conditions that might develop. To lower your risk of pregnancy complications, you and your health care provider will talk about any underlying conditions you have. How does this affect my baby? Early and consistent prenatal care increases the chance that your baby will be healthy during pregnancy. Prenatal care lowers the risk that your baby will be:  Born early (prematurely).  Smaller than expected at birth (small for gestational age). What can I expect at the first prenatal care visit? Your first prenatal care visit will likely be the longest. You should schedule your first prenatal care visit as soon as you know that you are pregnant. Your first visit is a good time to talk about any questions or concerns you have about pregnancy. Medical history At your visit, you and your health care provider will talk about your medical history, including:  Any past pregnancies.  Your family's medical history.  Medical history of the baby's  father.  Any long-term (chronic) health conditions you have and how you manage them.  Any surgeries or procedures you have had.  Any current over-the-counter or prescription medicines, herbs, or supplements that you are taking.  Other factors that could pose a risk to your baby, including: ? Exposure to harmful chemicals or radiation at work or at home. ? Any substance use, including tobacco, alcohol, and drug use.  Your home setting and your stress levels, including: ? Exposure to abuse or violence. ? Household financial strain.  Your daily health habits, including diet and exercise. Tests and screenings Your health care provider will:  Measure your weight, height, and blood pressure.  Do a physical exam, including a pelvic and breast exam.  Perform blood tests and urine tests to check for: ? Urinary tract infection. ? Sexually transmitted infections (STIs). ? Low iron levels in your blood (anemia). ? Blood type and certain proteins on red blood cells (Rh antibodies). ? Infections and immunity to viruses, such as hepatitis B and rubella. ? HIV (human immunodeficiency virus).  Discuss your options for genetic screening. Tips about staying healthy Your health care provider will also give you information about how to keep yourself and your baby healthy, including:  Nutrition and taking vitamins.  Physical activity.  How to manage pregnancy symptoms such as nausea and vomiting (morning sickness).  Infections and substances that may be harmful to your baby and how to avoid them.  Food safety.  Dental care.  Working.  Travel.  Warning signs to watch for and when to call your health care provider. How often will I have prenatal care visits? After your first prenatal care visit, you will have regular visits throughout your pregnancy. The visit schedule is often as follows:  Up to week 28 of pregnancy: once every 4 weeks.  28-36 weeks: once every 2 weeks.  After 36  weeks: every week until delivery. Some women may have visits more or less often depending on any underlying health conditions and the health of the baby. Keep all follow-up and prenatal care visits. This is important. What happens during routine prenatal care visits? Your health care provider will:  Measure your weight and blood pressure.  Check for fetal heart sounds.  Measure the height of your uterus in your abdomen (fundal height). This  may be measured starting around week 20 of pregnancy.  Check the position of your baby inside your uterus.  Ask questions about your diet, sleeping patterns, and whether you can feel the baby move.  Review warning signs to watch for and signs of labor.  Ask about any pregnancy symptoms you are having and how you are dealing with them. Symptoms may include: ? Headaches. ? Nausea and vomiting. ? Vaginal discharge. ? Swelling. ? Fatigue. ? Constipation. ? Changes in your vision. ? Feeling persistently sad or anxious. ? Any discomfort, including back or pelvic pain. ? Bleeding or spotting. Make a list of questions to ask your health care provider at your routine visits.   What tests might I have during prenatal care visits? You may have blood, urine, and imaging tests throughout your pregnancy, such as:  Urine tests to check for glucose, protein, or signs of infection.  Glucose tests to check for a form of diabetes that can develop during pregnancy (gestational diabetes mellitus). This is usually done around week 24 of pregnancy.  Ultrasounds to check your baby's growth and development, to check for birth defects, and to check your baby's well-being. These can also help to decide when you should deliver your baby.  A test to check for group B strep (GBS) infection. This is usually done around week 36 of pregnancy.  Genetic testing. This may include blood, fluid, or tissue sampling, or imaging tests, such as an ultrasound. Some genetic tests  are done during the first trimester and some are done during the second trimester. What else can I expect during prenatal care visits? Your health care provider may recommend getting certain vaccines during pregnancy. These may include:  A yearly flu shot (annual influenza vaccine). This is especially important if you will be pregnant during flu season.  Tdap (tetanus, diphtheria, pertussis) vaccine. Getting this vaccine during pregnancy can protect your baby from whooping cough (pertussis) after birth. This vaccine may be recommended between weeks 27 and 36 of pregnancy.  A COVID-19 vaccine. Later in your pregnancy, your health care provider may give you information about:  Childbirth and breastfeeding classes.  Choosing a health care provider for your baby.  Umbilical cord banking.  Breastfeeding.  Birth control after your baby is born.  The hospital labor and delivery unit and how to set up a tour.  Registering at the hospital before you go into labor. Where to find more information  Office on Women's Health: LegalWarrants.gl  American Pregnancy Association: americanpregnancy.org  March of Dimes: marchofdimes.org Summary  Prenatal care helps you and your baby stay as healthy as possible during pregnancy.  Your first prenatal care visit will most likely be the longest.  You will have visits and tests throughout your pregnancy to monitor your health and your baby's health.  Bring a list of questions to your visits to ask your health care provider.  Make sure to keep all follow-up and prenatal care visits. This information is not intended to replace advice given to you by your health care provider. Make sure you discuss any questions you have with your health care provider. Document Revised: 07/16/2020 Document Reviewed: 07/16/2020 Elsevier Patient Education  2021 [place]. Morning Sickness  Morning sickness is when you feel like you may vomit (feel nauseous)  during pregnancy. Sometimes, you may vomit. Morning sickness most often happens in the morning, but it can also happen at any time of the day. Some women may have morning sickness that makes them vomit  all the time. This is a more serious problem that needs treatment. What are the causes? The cause of this condition is not known. What increases the risk?  You had vomiting or a feeling like you may vomit before your pregnancy.  You had morning sickness in another pregnancy.  You are pregnant with more than one baby, such as twins. What are the signs or symptoms?  Feeling like you may vomit.  Vomiting. How is this treated? Treatment is usually not needed for this condition. You may only need to change what you eat. In some cases, your doctor may give you some things to take for your condition. These include:  Vitamin B6 supplements.  Medicines to treat the feeling that you may vomit.  Ginger. Follow these instructions at home: Medicines  Take over-the-counter and prescription medicines only as told by your doctor. Do not take any medicines until you talk with your doctor about them first.  Take multivitamins before you get pregnant. These can stop or lessen the symptoms of morning sickness. Eating and drinking  Eat dry toast or crackers before getting out of bed.  Eat 5 or 6 small meals a day.  Eat dry and bland foods like rice and baked potatoes.  Do not eat greasy, fatty, or spicy foods.  Have someone cook for you if the smell of food causes you to vomit or to feel like you may vomit.  If you feel like you may vomit after taking prenatal vitamins, take them at night or with a snack.  Eat protein foods when you need a snack. Nuts, yogurt, and cheese are good choices.  Drink fluids throughout the day.  Try ginger ale made with real ginger, ginger tea made from fresh grated ginger, or ginger candies. General instructions  Do not smoke or use any products that contain  nicotine or tobacco. If you need help quitting, ask your doctor.  Use an air purifier to keep the air in your house free of smells.  Get lots of fresh air.  Try to avoid smells that make you feel sick.  Try wearing an acupressure wristband. This is a wristband that is used to treat seasickness.  Try a treatment called acupuncture. In this treatment, a doctor puts needles into certain areas of your body to make you feel better. Contact a doctor if:  You need medicine to feel better.  You feel dizzy or light-headed.  You are losing weight. Get help right away if:  The feeling that you may vomit will not go away, or you cannot stop vomiting.  You faint.  You have very bad pain in your belly. Summary  Morning sickness is when you feel like you may vomit (feel nauseous) during pregnancy.  You may feel sick in the morning, but you can feel this way at any time of the day.  Making some changes to what you eat may help your symptoms go away. This information is not intended to replace advice given to you by your health care provider. Make sure you discuss any questions you have with your health care provider. Document Revised: 05/18/2020 Document Reviewed: 04/27/2020 Elsevier Patient Education  2021 [employer]. AboveDiscount.com.cy.html">  First Trimester of Pregnancy  The first trimester of pregnancy starts on the first day of your last menstrual period until the end of week 12. This is also called months 1 through 3 of pregnancy. Body changes during your first trimester Your body goes through many changes during pregnancy. The changes  usually return to normal after your baby is born. Physical changes  You may gain or lose weight.  Your breasts may grow larger and hurt. The area around your nipples may get darker.  Dark spots or blotches may develop on your face.  You may have changes in your hair. Health changes  You may feel like you might vomit  (nauseous), and you may vomit.  You may have heartburn.  You may have headaches.  You may have trouble pooping (constipation).  Your gums may bleed. Other changes  You may get tired easily.  You may pee (urinate) more often.  Your menstrual periods will stop.  You may not feel hungry.  You may want to eat certain kinds of food.  You may have changes in your emotions from day to day.  You may have more dreams. Follow these instructions at home: Medicines  Take over-the-counter and prescription medicines only as told by your doctor. Some medicines are not safe during pregnancy.  Take a prenatal vitamin that contains at least 600 micrograms (mcg) of folic acid. Eating and drinking  Eat healthy meals that include: ? Fresh fruits and vegetables. ? Whole grains. ? Good sources of protein, such as meat, eggs, or tofu. ? Low-fat dairy products.  Avoid raw meat and unpasteurized juice, milk, and cheese.  If you feel like you may vomit, or you vomit: ? Eat 4 or 5 small meals a day instead of 3 large meals. ? Try eating a few soda crackers. ? Drink liquids between meals instead of during meals.  You may need to take these actions to prevent or treat trouble pooping: ? Drink enough fluids to keep your pee (urine) pale yellow. ? Eat foods that are high in fiber. These include beans, whole grains, and fresh fruits and vegetables. ? Limit foods that are high in fat and sugar. These include fried or sweet foods. Activity  Exercise only as told by your doctor. Most people can do their usual exercise routine during pregnancy.  Stop exercising if you have cramps or pain in your lower belly (abdomen) or low back.  Do not exercise if it is too hot or too humid, or if you are in a place of great height (high altitude).  Avoid heavy lifting.  If you choose to, you may have sex unless your doctor tells you not to. Relieving pain and discomfort  Wear a good support bra if your  breasts are sore.  Rest with your legs raised (elevated) if you have leg cramps or low back pain.  If you have bulging veins (varicose veins) in your legs: ? Wear support hose as told by your doctor. ? Raise your feet for 15 minutes, 3-4 times a day. ? Limit salt in your food. Safety  Wear your seat belt at all times when you are in a car.  Talk with your doctor if someone is hurting you or yelling at you.  Talk with your doctor if you are feeling sad or have thoughts of hurting yourself. Lifestyle  Do not use hot tubs, steam rooms, or saunas.  Do not douche. Do not use tampons or scented sanitary pads.  Do not use herbal medicines, illegal drugs, or medicines that are not approved by your doctor. Do not drink alcohol.  Do not smoke or use any products that contain nicotine or tobacco. If you need help quitting, ask your doctor.  Avoid cat litter boxes and soil that is used by cats. These carry  germs that can cause harm to the baby and can cause a loss of your baby by miscarriage or stillbirth. General instructions  Keep all follow-up visits. This is important.  Ask for help if you need counseling or if you need help with nutrition. Your doctor can give you advice or tell you where to go for help.  Visit your dentist. At home, brush your teeth with a soft toothbrush. Floss gently.  Write down your questions. Take them to your prenatal visits. Where to find more information  American Pregnancy Association: americanpregnancy.org  [employer]necologists: www.acog.org  Office on Women's Health: KeywordPortfolios.com.br Contact a doctor if:  You are dizzy.  You have a fever.  You have mild cramps or pressure in your lower belly.  You have a nagging pain in your belly area.  You continue to feel like you may vomit, you vomit, or you have watery poop (diarrhea) for 24 hours or longer.  You have a bad-smelling fluid coming from your  vagina.  You have pain when you pee.  You are exposed to a disease that spreads from person to person, such as chickenpox, measles, Zika virus, HIV, or hepatitis. Get help right away if:  You have spotting or bleeding from your vagina.  You have very bad belly cramping or pain.  You have shortness of breath or chest pain.  You have any kind of injury, such as from a fall or a car crash.  You have new or increased pain, swelling, or redness in an arm or leg. Summary  The first trimester of pregnancy starts on the first day of your last menstrual period until the end of week 12 (months 1 through 3).  Eat 4 or 5 small meals a day instead of 3 large meals.  Do not smoke or use any products that contain nicotine or tobacco. If you need help quitting, ask your doctor.  Keep all follow-up visits. This information is not intended to replace advice given to you by your health care provider. Make sure you discuss any questions you have with your health care provider. Document Revised: 03/11/2020 Document Reviewed: 01/16/2020 Elsevier Patient Education  2021 [place]. Commonly Asked Questions During Pregnancy  Cats: A parasite can be excreted in cat feces.  To avoid exposure you need to have another person empty the little box.  If you must empty the litter box you will need to wear gloves.  Wash your hands after handling your cat.  This parasite can also be found in raw or undercooked meat so this should also be avoided.  Colds, Sore Throats, Flu: Please check your medication sheet to see what you can take for symptoms.  If your symptoms are unrelieved by these medications please call the office.  Dental Work: Most any dental work Investment banker, corporate recommends is permitted.  X-rays should only be taken during the first trimester if absolutely necessary.  Your abdomen should be shielded with a lead apron during all x-rays.  Please notify your provider prior to receiving any x-rays.  Novocaine  is fine; gas is not recommended.  If your dentist requires a note from [place] prior to dental work please call the office and we will provide one for you.  Exercise: Exercise is an important part of staying healthy during your pregnancy.  You may continue most exercises you were accustomed to prior to pregnancy.  Later in your pregnancy you will most likely notice you have difficulty with activities requiring  balance like riding a bicycle.  It is important that you listen to your body and avoid activities that put you at a higher risk of falling.  Adequate rest and staying well hydrated are a must!  If you have questions about the safety of specific activities ask your provider.    Exposure to Children with illness: Try to avoid obvious exposure; report any symptoms to [place] when noted,  If you have chicken pos, red measles or mumps, you should be immune to these diseases.   Please do not take any vaccines while pregnant unless you have checked with your OB provider.  Fetal Movement: After 28 weeks we recommend you do "kick counts" twice daily.  Lie or sit down in a calm quiet environment and count your baby movements "kicks".  You should feel your baby at least 10 times per hour.  If you have not felt 10 kicks within the first hour get up, walk around and have something sweet to eat or drink then repeat for an additional hour.  If count remains less than 10 per hour notify your provider.  Fumigating: Follow your pest control agent's advice as to how long to stay out of your home.  Ventilate the area well before re-entering.  Hemorrhoids:   Most over-the-counter preparations can be used during pregnancy.  Check your medication to see what is safe to use.  It is important to use a stool softener or fiber in your diet and to drink lots of liquids.  If hemorrhoids seem to be getting worse please call the office.   Hot Tubs:  Hot tubs Jacuzzis and saunas are not recommended while pregnant.  These increase your  internal body temperature and should be avoided.  Intercourse:  Sexual intercourse is safe during pregnancy as long as you are comfortable, unless otherwise advised by your provider.  Spotting may occur after intercourse; report any bright red bleeding that is heavier than spotting.  Labor:  If you know that you are in labor, please go to the hospital.  If you are unsure, please call the office and let us help you decide what to do.  Lifting, straining, etc:  If your job requires heavy lifting or straining please check with your provider for any limitations.  Generally, you should not lift items heavier than that you can lift simply with your hands and arms (no back muscles)  Painting:  Paint fumes do not harm your pregnancy, but may make you ill and should be avoided if possible.  Latex or water based paints have less odor than oils.  Use adequate ventilation while painting.  Permanents & Hair Color:  Chemicals in hair dyes are not recommended as they cause increase hair dryness which can increase hair loss during pregnancy.  " Highlighting" and permanents are allowed.  Dye may be absorbed differently and permanents may not hold as well during pregnancy.  Sunbathing:  Use a sunscreen, as skin burns easily during pregnancy.  Drink plenty of fluids; avoid over heating.  Tanning Beds:  Because their possible side effects are still unknown, tanning beds are not recommended.  Ultrasound Scans:  Routine ultrasounds are performed at approximately 20 weeks.  You will be able to see your baby's general anatomy an if you would like to know the gender this can usually be determined as well.  If it is questionable when you conceived you may also receive an ultrasound early in your pregnancy for dating purposes.  Otherwise ultrasound exams are not  routinely performed unless there is a medical necessity.  Although you can request a scan we ask that you pay for it when conducted because insurance does not cover "  patient request" scans.  Work: If your pregnancy proceeds without complications you may work until your due date, unless your physician or employer advises otherwise.  Round Ligament Pain/Pelvic Discomfort:  Sharp, shooting pains not associated with bleeding are fairly common, usually occurring in the second trimester of pregnancy.  They tend to be worse when standing up or when you remain standing for long periods of time.  These are the result of pressure of certain pelvic ligaments called "round ligaments".  Rest, Tylenol and heat seem to be the most effective relief.  As the womb and fetus grow, they rise out of the pelvis and the discomfort improves.  Please notify the office if your pain seems different than that described.  It may represent a more serious condition.  Common Medications Safe in Pregnancy  Acne:      Constipation:  Benzoyl Peroxide     Colace  Clindamycin      Dulcolax Suppository  Topica Erythromycin     Fibercon  Salicylic Acid      Metamucil         Miralax AVOID:        Senakot   Accutane    Cough:  Retin-A       Cough Drops  Tetracycline      Phenergan w/ Codeine if Rx  Minocycline      Robitussin (Plain & DM)  Antibiotics:     Crabs/Lice:  Ceclor       RID  Cephalosporins    AVOID:  E-Mycins      Kwell  Keflex  Macrobid/Macrodantin   Diarrhea:  Penicillin      Kao-Pectate  Zithromax      Imodium AD         PUSH FLUIDS AVOID:       Cipro     Fever:  Tetracycline      Tylenol (Regular or Extra  Minocycline       Strength)  Levaquin      Extra Strength-Do not          Exceed 8 tabs/24 hrs Caffeine:        <217m/day (equiv. To 1 cup of coffee or  approx. 3 12 oz sodas)         Gas: Cold/Hayfever:       Gas-X  Benadryl      Mylicon  Claritin       Phazyme  **Claritin-D        Chlor-Trimeton    Headaches:  Dimetapp      ASA-Free Excedrin  Drixoral-Non-Drowsy     Cold Compress  Mucinex (Guaifenasin)     Tylenol (Regular or Extra  Sudafed/Sudafed-12  Hour     Strength)  **Sudafed PE Pseudoephedrine   Tylenol Cold & Sinus     Vicks Vapor Rub  Zyrtec  **AVOID if Problems With Blood Pressure         Heartburn: Avoid lying down for at least 1 hour after meals  Aciphex      Maalox     Rash:  Milk of Magnesia     Benadryl    Mylanta       1% Hydrocortisone Cream  Pepcid  Pepcid Complete   Sleep Aids:  Prevacid      Ambien   Prilosec  Benadryl  Rolaids       Chamomile Tea  Tums (Limit 4/day)     Unisom         Tylenol PM         Warm milk-add vanilla or  Hemorrhoids:       Sugar for taste  Anusol/Anusol H.C.  (RX: Analapram 2.5%)  Sugar Substitutes:  Hydrocortisone OTC     Ok in moderation  Preparation H      Tucks        Vaseline lotion applied to tissue with wiping    Herpes:     Throat:  Acyclovir      Oragel  Famvir  Valtrex     Vaccines:         Flu Shot Leg Cramps:       *Gardasil  Benadryl      Hepatitis A         Hepatitis B Nasal Spray:       Pneumovax  Saline Nasal Spray     Polio Booster         Tetanus Nausea:       Tuberculosis test or PPD  Vitamin B6 25 mg TID   AVOID:    Dramamine      *Gardasil  Emetrol       Live Poliovirus  Ginger Root 250 mg QID    MMR (measles, mumps &  High Complex Carbs @ Bedtime    rebella)  Sea Bands-Accupressure    Varicella (Chickenpox)  Unisom 1/2 tab TID     *No known complications           If received before Pain:         Known pregnancy;   Darvocet       Resume series after  Lortab        Delivery  Percocet    Yeast:   Tramadol      Femstat  Tylenol 3      Gyne-lotrimin  Ultram       Monistat  Vicodin           MISC:         All Sunscreens           Hair Coloring/highlights          Insect Repellant's          (Including DEET)         Mystic Tans

## 2020-11-20 NOTE — Progress Notes (Signed)
Darlene Moreno presents for NOB nurse intake visit. Pregnancy confirmation done at [facility], 10/30/2020 , with J.M.Lawhorn, [place] 2.  P 1001.  LMP 09/02/2020.  EDD 06/09/2021. Pt had ultrasound on 11/11/2020 with [redacted]w[redacted]d EDD 06/02/2021. Ga [redacted]w[redacted]d. Pregnancy education material explained and given.  0 cats in the home.  NOB labs ordered. BMI greater than 30. TSH/HbgA1c. Sickle cell not ordeedr due to race. HIV and drug screen explained and ordered. Genetic screening discussed. Genetic testing; Unsure. Pt to discuss genetic testing with provider. PNV encouraged. Pt to follow up with provider in 2 weeks for NOB physical.  FLMA, [facility] Financial Policy, Drug and HIV screening reviewed and signed.

## 2020-11-20 NOTE — Progress Notes (Signed)
I have reviewed the record and concur with patient management and plan of care.    Dani Gobble, CNM [facility], [facility] 11/20/20 5:28 PM

## 2020-11-21 ENCOUNTER — Encounter: Payer: Self-pay | Admitting: Certified Nurse Midwife

## 2020-11-21 DIAGNOSIS — Z3490 Encounter for supervision of normal pregnancy, unspecified, unspecified trimester: Secondary | ICD-10-CM

## 2020-11-21 DIAGNOSIS — Z6740 Type O blood, Rh positive: Secondary | ICD-10-CM | POA: Insufficient documentation

## 2020-11-21 DIAGNOSIS — Z331 Pregnant state, incidental: Secondary | ICD-10-CM | POA: Insufficient documentation

## 2020-11-21 DIAGNOSIS — Z3480 Encounter for supervision of other normal pregnancy, unspecified trimester: Secondary | ICD-10-CM | POA: Insufficient documentation

## 2020-11-21 DIAGNOSIS — Z2839 Other underimmunization status: Secondary | ICD-10-CM | POA: Insufficient documentation

## 2020-11-21 DIAGNOSIS — O09899 Supervision of other high risk pregnancies, unspecified trimester: Secondary | ICD-10-CM | POA: Insufficient documentation

## 2020-11-21 HISTORY — DX: Encounter for supervision of normal pregnancy, unspecified, unspecified trimester: Z34.90

## 2020-11-21 LAB — URINALYSIS, ROUTINE W REFLEX MICROSCOPIC
Bilirubin, UA: NEGATIVE
Glucose, UA: NEGATIVE
Ketones, UA: NEGATIVE
Nitrite, UA: NEGATIVE
RBC, UA: NEGATIVE
Specific Gravity, UA: 1.02 (ref 1.005–1.030)
Urobilinogen, Ur: 0.2 mg/dL (ref 0.2–1.0)
pH, UA: 6.5 (ref 5.0–7.5)

## 2020-11-21 LAB — MICROSCOPIC EXAMINATION
Casts: NONE SEEN /lpf
Epithelial Cells (non renal): >10 /hpf — AB (ref 0–10)
WBC, UA: NONE SEEN /hpf (ref 0–5)

## 2020-11-21 LAB — TOXOPLASMA ANTIBODIES- IGG AND  IGM
Toxoplasma Antibody- IgM: <3 AU/mL (ref 0.0–7.9)
Toxoplasma IgG Ratio: <3 IU/mL (ref 0.0–7.1)

## 2020-11-21 LAB — VARICELLA ZOSTER ANTIBODY, IGG: Varicella zoster IgG: <135 index — ABNORMAL LOW (ref >165)

## 2020-11-21 LAB — VIRAL HEPATITIS HBV, HCV
HCV Ab: <0.1 s/co ratio (ref 0.0–0.9)
Hep B Core Total Ab: NEGATIVE
Hep B Surface Ab, Qual: REACTIVE
Hepatitis B Surface Ag: NEGATIVE

## 2020-11-21 LAB — ABO AND RH: Rh Factor: POSITIVE

## 2020-11-21 LAB — HIV ANTIBODY (ROUTINE TESTING W REFLEX): HIV Screen 4th Generation wRfx: NONREACTIVE

## 2020-11-21 LAB — RPR: RPR Ser Ql: NONREACTIVE

## 2020-11-21 LAB — HEPATITIS C ANTIBODY: Hep C Virus Ab: <0.1 s/co ratio (ref 0.0–0.9)

## 2020-11-21 LAB — HEMOGLOBIN A1C
Est. average glucose Bld gHb Est-mCnc: 100 mg/dL
Hgb A1c MFr Bld: 5.1 % (ref 4.8–5.6)

## 2020-11-21 LAB — RUBELLA SCREEN: Rubella Antibodies, IGG: 1.22 index (ref >0.99)

## 2020-11-21 LAB — HCV INTERPRETATION

## 2020-11-21 LAB — TSH: TSH: 1.75 u[IU]/mL (ref 0.450–4.500)

## 2020-11-22 LAB — GC/CHLAMYDIA PROBE AMP
Chlamydia trachomatis, NAA: NEGATIVE
Neisseria Gonorrhoeae by PCR: NEGATIVE

## 2020-11-23 LAB — CULTURE, OB URINE

## 2020-11-23 LAB — URINE CULTURE, OB REFLEX

## 2020-11-24 ENCOUNTER — Telehealth: Payer: Self-pay | Admitting: Certified Nurse Midwife

## 2020-11-24 NOTE — Telephone Encounter (Signed)
Addendum, Wrong information in previous note.

## 2020-11-24 NOTE — Telephone Encounter (Signed)
Spoke to patient about maternity global benefits, patient verbalized understanding.

## 2020-12-03 ENCOUNTER — Other Ambulatory Visit: Payer: Self-pay | Admitting: Certified Nurse Midwife

## 2020-12-03 ENCOUNTER — Other Ambulatory Visit: Payer: Self-pay

## 2020-12-03 ENCOUNTER — Ambulatory Visit (INDEPENDENT_AMBULATORY_CARE_PROVIDER_SITE_OTHER): Payer: BC Managed Care – PPO | Admitting: Certified Nurse Midwife

## 2020-12-03 ENCOUNTER — Other Ambulatory Visit (HOSPITAL_COMMUNITY)
Admission: RE | Admit: 2020-12-03 | Discharge: 2020-12-03 | Disposition: A | Discharge status: Home / Self Care | Payer: BC Managed Care – PPO | Source: Ambulatory Visit | Attending: Certified Nurse Midwife | Admitting: Certified Nurse Midwife

## 2020-12-03 ENCOUNTER — Encounter: Payer: Self-pay | Admitting: Certified Nurse Midwife

## 2020-12-03 ENCOUNTER — Telehealth: Payer: Self-pay | Admitting: Certified Nurse Midwife

## 2020-12-03 ENCOUNTER — Other Ambulatory Visit (INDEPENDENT_AMBULATORY_CARE_PROVIDER_SITE_OTHER): Payer: BC Managed Care – PPO

## 2020-12-03 VITALS — BP 126/78 | HR 109 | Wt 163.6 lb

## 2020-12-03 DIAGNOSIS — Z124 Encounter for screening for malignant neoplasm of cervix: Secondary | ICD-10-CM

## 2020-12-03 DIAGNOSIS — R58 Hemorrhage, not elsewhere classified: Secondary | ICD-10-CM

## 2020-12-03 DIAGNOSIS — Z3A13 13 weeks gestation of pregnancy: Secondary | ICD-10-CM | POA: Insufficient documentation

## 2020-12-03 DIAGNOSIS — O021 Missed abortion: Secondary | ICD-10-CM

## 2020-12-03 DIAGNOSIS — R638 Other symptoms and signs concerning food and fluid intake: Secondary | ICD-10-CM | POA: Diagnosis not present

## 2020-12-03 DIAGNOSIS — Z3481 Encounter for supervision of other normal pregnancy, first trimester: Secondary | ICD-10-CM

## 2020-12-03 DIAGNOSIS — Z3A11 11 weeks gestation of pregnancy: Secondary | ICD-10-CM | POA: Diagnosis not present

## 2020-12-03 LAB — POCT URINALYSIS DIPSTICK OB
Appearance: NORMAL
Bilirubin, UA: NEGATIVE
Blood, UA: NEGATIVE
Glucose, UA: NEGATIVE
Ketones, UA: NEGATIVE
Leukocytes, UA: NEGATIVE
Nitrite, UA: NEGATIVE
Odor: NORMAL
POC,PROTEIN,UA: NEGATIVE
Spec Grav, UA: 1.02 (ref 1.010–1.025)
Urobilinogen, UA: 0.2 E.U./dL
pH, UA: 6.5 (ref 5.0–8.0)

## 2020-12-03 NOTE — Progress Notes (Signed)
New OB Physical: She reports spotting this morning. EDD 06/09/2021

## 2020-12-03 NOTE — Telephone Encounter (Signed)
Telephone call to patient, HIPPA approved message asking patient left on identified line.    Dani Gobble, CNM [facility], [facility] 12/03/20 5:45 PM

## 2020-12-03 NOTE — Telephone Encounter (Signed)
Patient called this afternoon, wanted to relay message to [facility] that she wanted to go the medication route regarding the conversation that took place this morning.

## 2020-12-03 NOTE — Patient Instructions (Addendum)
FACTS YOU SHOULD KNOW  About Early Pregnancy Loss  WHAT IS AN EARLY PREGNANCY LOSS? Once the egg is fertilized with the sperm and begins to develop, it attaches to the lining of the uterus. This early pregnancy tissue may not develop into an embryo (the beginning stage of a baby). Sometimes an embryo does develop but does not continue to grow. These problems can be seen on ultrasound.   MANAGEMNT OF EARLY PREGNANCY LOSS: About 4 out of 100 (0.25%) women will have a pregnancy loss in her lifetime.  One in five pregnancies is found to be an early pregnancy loss.  There are 3 ways to care for an early pregnancy loss:   (1) Surgery, (2) Medicine, (3) Waiting for you to pass the pregnancy on your own. The decision as to how to proceed after being diagnosed with and early pregnancy loss is an individual one.  The decision can be made only after appropriate counseling.  You need to weigh the pros and cons of the 3 choices. Then you can make the choice that works for you.  SURGERY (D&E) . Procedure over in 1 day . Requires being put to sleep . Bleeding may be light . Possible problems during surgery, including injury to womb(uterus) . Care provider has more control Medicine ([place]) . The complete procedure may take days to weeks . No Surgery . Bleeding may be heavy at times . There may be drug side effects . Patient has more control Waiting . You may choose to wait, in which case your own body may complete the passing of the abnormal early pregnancy on its own in about 2-4 weeks . Your bleeding may be heavy at times . There is a small possibility that you may need surgery if the bleeding is too much or not all of the pregnancy has passed.  CYTOTEC MANAGEMENT Prostaglandins (cytotec) are the most widely used drug for this purpose. They cause the uterus to cramp and contract. You will place the medicine yourself inside your vagina in the privacy of your home. Empting of the uterus should occur  within 3 days but the process may continue for several weeks. The bleeding may seem heavy at times.  INSTRUCTIONS: Take all 4 tablets of cytotec (88mg total) at one time. This will cause a lot of cramping, you may have bleeding, and pass tissue, then the cramping and bleeding should get better. If you do not pass the tissue, then you can take 4 more tablets of cytotec (8092m total) 48 hours after your first dose.  You will come back to have your blood drawn to make sure the pregnancy hormones are dropping in 1 week. Please call us[place]af you have any questions.   POSSIBLE SIDE EFFECTS FROM CYTOTEC . Nausea  Vomiting . Diarrhea Fever . Chills  Hot Flashes Side effects  from the process of the early pregnancy loss include: . Cramping  Bleeding . Headaches  Dizziness RISKS: This is a low risk procedure. Less than 1 in 100 women has a complication. An incomplete passage of the early pregnancy may occur. Also, hemorrhage (heavy bleeding) could happen.  Rarely the pregnancy will not be passed completely. Excessively heavy bleeding may occur.  Your doctor may need to perform surgery to empty the uterus (D&E). Afterwards: Everybody will feel differently after the early pregnancy loss completion. You may have soreness or cramps for a day or two. You may have soreness or cramps for day or two.  You may have light  bleeding for up to 2 weeks. You may be as active as you feel like being. If you have any of the following problems you may call [facility] at [phone]. . If you have pain that does not get better with pain medication . Bleeding that soaks through 2 thick full-sized sanitary pads in an hour . Cramps that last longer than 2 days . Foul smelling discharge . Fever above 100.4 degrees F Even if you do not have any of these symptoms, you should have a follow-up exam to make sure you are healing properly. Your next normal period will usually start again in 4-6 week after the loss. You can  get pregnant soon after the loss, so use birth control right away. Finally: Make sure all your questions are answered before during and after any procedure. Follow up with medical care and family planning methods.

## 2020-12-04 ENCOUNTER — Telehealth: Payer: Self-pay | Admitting: Certified Nurse Midwife

## 2020-12-04 DIAGNOSIS — O021 Missed abortion: Secondary | ICD-10-CM

## 2020-12-04 NOTE — Telephone Encounter (Signed)
Telephone call to patient, HIPPA approved message asking patient to call back left on identified line.    Dani Gobble, CNM [facility], [facility] 12/04/20 12:12 PM

## 2020-12-04 NOTE — Telephone Encounter (Signed)
Telephone call from patient, verified full name and date of birth.   Discussed missed abortion management options. Patient consider home Cytotec. Advised possibility of increased bleeding with size of pregnancy, abdominal cramping and incomplete emptying of the uterus and need for further dosing.   Patient open to scheduling D&E next week, MD consulted. Will proceed and schedule for Monday with Dr. Marcelline Mates.   Reviewed red flag symptoms and when to call.   Appointment will be scheduled with Dr. Marcelline Mates for pre-op on Monday prior to procedure.    Dani Gobble, CNM [facility], [facility] 12/04/20 12:36 PM

## 2020-12-04 NOTE — Telephone Encounter (Signed)
Pt called in and said she missed a call from [place]. Called back and let Sharyn Lull know the pt is on line 3.

## 2020-12-04 NOTE — Telephone Encounter (Signed)
Patient called this morning returning your phone call from yesterday- I made her aware you were not yet in office and will reach back out when you are able- she is very understanding!

## 2020-12-05 ENCOUNTER — Encounter: Payer: Self-pay | Admitting: Certified Nurse Midwife

## 2020-12-05 NOTE — Progress Notes (Signed)
NEW OB HISTORY AND PHYSICAL  SUBJECTIVE:       Darlene Moreno is a 35 y.o. [id number] female, Patient's last menstrual period was 09/02/2020 (exact date)., Estimated Date of Delivery: 06/09/21, [redacted]w[redacted]d, presents today for establishment of Prenatal Care.  Reports vaginal spotting starting this morning; mostly with wiping. Notes intermittent abdominal cramping and nausea without vomiting.   Denies difficulty breathing or respiratory distress, chest pain, dysuria, and leg pain or swelling.   Considering genetic screening. Desires midwifery care.    Gynecologic History  Patient's last menstrual period was 09/02/2020 (exact date).   Contraception: none  Last Pap: due.   Obstetric History  OB History  Gravida Para Term Preterm AB Living  2 1 1     1   SAB IAB Ectopic Multiple Live Births          1    # Outcome Date GA Lbr Len/2nd Weight Sex Delivery Anes PTL Lv  2 Current           1 Term 03/02/10   7 lb 2 oz (3.232 kg) M Vag-Spont EPI N LIV    Past Medical History:  Diagnosis Date  . Wears contact lenses     Past Surgical History:  Procedure Laterality Date  . EXCISION MASS NECK N/A 10/07/2020   Procedure: EXCISION  NECK EPIDERMAL INCLUSION CYST;  Surgeon: Carloyn Manner, MD;  Location: [facility];  Service: ENT;  Laterality: N/A;  Local    Current Outpatient Medications on File Prior to Visit  Medication Sig Dispense Refill  . Prenatal Vit-Fe Fumarate-FA (MULTIVITAMIN-PRENATAL) 27-0.8 MG TABS tablet Take 1 tablet by mouth daily at 12 noon.     No current facility-administered medications on file prior to visit.    Allergies  Allergen Reactions  . Amoxicillin Rash    Social History   Socioeconomic History  . Marital status: Single    Spouse name: Not on file  . Number of children: Not on file  . Years of education: Not on file  . Highest education level: Not on file  Occupational History  . Not on file  Tobacco Use  . Smoking status: Never Smoker   . Smokeless tobacco: Never Used  Vaping Use  . Vaping Use: Never used  Substance and Sexual Activity  . Alcohol use: Not Currently  . Drug use: Never  . Sexual activity: Yes    Birth control/protection: None  Other Topics Concern  . Not on file  Social History Narrative  . Not on file   Social Determinants of Health   Financial Resource Strain: Not on file  Food Insecurity: Not on file  Transportation Needs: Not on file  Physical Activity: Not on file  Stress: Not on file  Social Connections: Not on file  Intimate Partner Violence: Not on file    Family History  Problem Relation Age of Onset  . Hypertension Mother   . Diabetes Father     The following portions of the patient's history were reviewed and updated as appropriate: allergies, current medications, past OB history, past medical history, past surgical history, past family history, past social history, and problem list.  Review of Systems:  ROS negative except as noted above. Information obtained from patient.   OBJECTIVE:  BP 126/78   Pulse (!) 109   Wt 163 lb 9.6 oz (74.2 kg)   LMP 09/02/2020 (Exact Date)   BMI 30.91 kg/m   Initial Physical Exam (New OB)  GENERAL APPEARANCE: alert, well appearing,  in no apparent distress  HEAD: normocephalic, atraumatic  MOUTH: deferred due to COVID-19 pandemic  THYROID: no thyromegaly or masses present  BREASTS: no masses noted, no significant tenderness, no palpable axillary nodes, no skin changes  LUNGS: clear to auscultation, no wheezes, rales or rhonchi, symmetric air entry  HEART: regular rate and rhythm, no murmurs  ABDOMEN: soft, nontender, nondistended, no abnormal masses, no epigastric pain and FHT not heard  EXTREMITIES: no redness or tenderness in the calves or thighs, no edema  SKIN: normal coloration and turgor, no rashes  LYMPH NODES: no adenopathy palpable  NEUROLOGIC: alert, oriented, normal speech, no focal findings or movement disorder  noted  PELVIC EXAM EXTERNAL GENITALIA: normal appearing vulva with no masses, tenderness or lesions VAGINA: no abnormal discharge or lesions and blood clot present, marble sized CERVIX: bloody discharge present and Pap collected ULTRASOUND REPORT  Location: [facility] Date of Service: 12/03/2020   Indications Check fetal heart tones Findings:  Singleton intrauterine pregnancy is visualized with a CRL consistent with [redacted]w[redacted]d gestation, giving an (U/S) EDD of 06/23/2021.   FHR: no fetal heart tones.  CRL measurement: 423.8 mm Yolk sac is visualized and appears normal and early anatomy is normal. Amnion: not visualized   Right Ovary is normal in appearance. Left Ovary is normal appearance. Corpus luteal cyst:  is not visualized Survey of the adnexa demonstrates no adnexal masses. There is no free peritoneal fluid in the cul de sac.  Impression: 1. 104w1d  Singleton Intrauterine pregnancy by U/S. 2. (U/S) EDD is consistent with Clinically established EDD of 06/23/2021. 3. No fetal heart tones seen.  Recommendations: 1.Clinical correlation with the patient's History and Physical Exam.  ASSESSMENT: Missed abortion Rh positive Screening cervical cancer  PLAN:  Ultrasound findings discussed with patient, verbalized understanding.   Condolences offered.   Discussed missed abortion management options, see AVS.   Patient will call office with decision.   Reviewed red flag symptoms and when to call.    Dani Gobble, CNM [facility], [facility]

## 2020-12-06 MED ORDER — CHLORHEXIDINE GLUCONATE 0.12 % MT SOLN: 15.0000 mL | Freq: Once | OROMUCOSAL | Status: DC

## 2020-12-06 MED ORDER — DOXYCYCLINE HYCLATE 100 MG IV SOLR
200.0000 mg | INTRAVENOUS | Status: DC
  Filled 2020-12-06: qty 200

## 2020-12-06 MED ORDER — LACTATED RINGERS IV SOLN: INTRAVENOUS | Status: DC

## 2020-12-06 MED ORDER — ORAL CARE MOUTH RINSE: 15.0000 mL | Freq: Once | OROMUCOSAL | Status: DC

## 2020-12-06 NOTE — H&P (Incomplete)
GYNECOLOGY PREOPERATIVE HISTORY AND PHYSICAL  Subjective:  Darlene Moreno is a 35 y.o. G2P1011 here for surgical management of missed abortion at [redacted] weeks gestation. No significant preoperative concerns.  Proposed surgery: Suction Dilation and Evacuation    Pertinent Gynecological History: Patient's last menstrual period was 09/02/2020 (exact date). Last pap: due   Past Medical History:  Diagnosis Date  . Wears contact lenses    Past Surgical History:  Procedure Laterality Date  . EXCISION MASS NECK N/A 10/07/2020   Procedure: EXCISION  NECK EPIDERMAL INCLUSION CYST;  Surgeon: Carloyn Manner, MD;  Location: [facility];  Service: ENT;  Laterality: N/A;  Local  . NO PAST SURGERIES     OB History  Gravida Para Term Preterm AB Living  2 1 1   1 1   SAB IAB Ectopic Multiple Live Births          1    # Outcome Date GA Lbr Len/2nd Weight Sex Delivery Anes PTL Lv  2 AB 12/05/20 [redacted]w[redacted]d         1 Term 03/02/10   3232 g M Vag-Spont EPI N LIV    Family History  Problem Relation Age of Onset  . Hypertension Mother   . Diabetes Father     Social History   Socioeconomic History  . Marital status: Single    Spouse name: Not on file  . Number of children: Not on file  . Years of education: Not on file  . Highest education level: Not on file  Occupational History  . Not on file  Tobacco Use  . Smoking status: Never Smoker  . Smokeless tobacco: Never Used  Vaping Use  . Vaping Use: Never used  Substance and Sexual Activity  . Alcohol use: Not Currently  . Drug use: Never  . Sexual activity: Yes    Birth control/protection: None  Other Topics Concern  . Not on file  Social History Narrative  . Not on file   Social Determinants of Health   Financial Resource Strain: Not on file  Food Insecurity: Not on file  Transportation Needs: Not on file  Physical Activity: Not on file  Stress: Not on file  Social Connections: Not on file  Intimate Partner  Violence: Not on file    No current facility-administered medications on file prior to encounter.   Current Outpatient Medications on File Prior to Encounter  Medication Sig Dispense Refill  . Prenatal Vit-Fe Fumarate-FA (MULTIVITAMIN-PRENATAL) 27-0.8 MG TABS tablet Take 1 tablet by mouth daily at 12 noon.      Allergies  Allergen Reactions  . Amoxicillin Rash     Review of Systems Constitutional: No recent fever/chills/sweats Respiratory: No recent cough/bronchitis Cardiovascular: No chest pain Gastrointestinal: No recent nausea/vomiting/diarrhea Genitourinary: No UTI symptoms. Positive for vaginal spotting/bleeding Hematologic/lymphatic:No history of coagulopathy or recent blood thinner use    Objective:   Last menstrual period 09/02/2020. CONSTITUTIONAL: Well-developed, well-nourished female in no acute distress.  HENT:  Normocephalic, atraumatic, External right and left ear normal. Oropharynx is clear and moist EYES: Conjunctivae and EOM are normal. Pupils are equal, round, and reactive to light. No scleral icterus.  NECK: Normal range of motion, supple, no masses SKIN: Skin is warm and dry. No rash noted. Not diaphoretic. No erythema. No pallor. NEUROLOGIC: Alert and oriented to person, place, and time. Normal reflexes, muscle tone coordination. No cranial nerve deficit noted. PSYCHIATRIC: Normal mood and affect. Normal behavior. Normal judgment and thought content. CARDIOVASCULAR: Normal heart  rate noted, regular rhythm RESPIRATORY: Effort and breath sounds normal, no problems with respiration noted ABDOMEN: Soft, nontender, nondistended. PELVIC: Deferred MUSCULOSKELETAL: Normal range of motion. No edema and no tenderness. 2+ distal pulses.    Labs: Results for orders placed or performed in visit on 12/03/20 (from the past 336 hour(s))  POC Urinalysis Dipstick OB   Collection Time: 12/03/20 11:17 AM  Result Value Ref Range   Color, UA Yellow    Clarity, UA Clear     Glucose, UA Negative Negative   Bilirubin, UA Negative    Ketones, UA Negative    Spec Grav, UA 1.020 1.010 - 1.025   Blood, UA Negative    pH, UA 6.5 5.0 - 8.0   POC,PROTEIN,UA Negative Negative, Trace, Small (1+), Moderate (2+), Large (3+), 4+   Urobilinogen, UA 0.2 0.2 or 1.0 E.U./dL   Nitrite, UA Negative    Leukocytes, UA Negative Negative   Appearance Normal    Odor Normal      Imaging Studies: US OB Comp Less 14 Wks  Result Date: 12/06/2020 Patient Name: DEAH OTTAWAY DOB: [birth year] MRN: [id number] ULTRASOUND REPORT Location: [facility] Date of Service: 12/03/2020 Indications: Viability Findings: Nelda Marseille intrauterine pregnancy is visualized with a CRL consistent with [redacted]w[redacted]d gestation, giving an (U/S) EDD of 06/23/2021. FHR: no fetal heart tones. CRL measurement: 423.8 mm Yolk sac is visualized and appears normal and early anatomy is normal. Amnion: not visualized Right Ovary is normal in appearance. Left Ovary is normal appearance. Corpus luteal cyst:  is not visualized Survey of the adnexa demonstrates no adnexal masses. There is no free peritoneal fluid in the cul de sac. Impression: 1. [redacted]w[redacted]d  Singleton Intrauterine pregnancy by U/S. 2. (U/S) EDD is consistent with Clinically established EDD of 06/23/2021. 3. No fetal heart tones seen. Recommendations: 1.Clinical correlation with the patient's History and Physical Exam. Jenine M. Albertine Grates     RDMS I have reviewed this study and agree with documented findings. Rubie Maid, MD [facility]  [place] [facility] LESS THAN 14 WEEKS WITH [place] TRANSVAGINAL  Result Date: 11/19/2020 Patient Name: ALIZ MERITT DOB: [birth year] MRN: [id number] ULTRASOUND REPORT Location: [facility] Date of Service: 11/11/2020 Indications:dating Findings: Nelda Marseille intrauterine pregnancy is visualized with a CRL consistent with [redacted]w[redacted]d gestation, giving an (U/S) EDD of 06/02/2021. The (U/S) EDD is consistent with the clinically established EDD of  06/09/2021. FHR: 165 BPM CRL measurement: 40.5 mm Yolk sac is not visualized. Amnion: visualized and appears normal Right Ovary is normal in appearance. Left Ovary is normal appearance. Corpus luteal cyst:  is not visualized Survey of the adnexa demonstrates no adnexal masses. There is no free peritoneal fluid in the cul de sac. Impression: 1. [redacted]w[redacted]d Viable Singleton Intrauterine pregnancy by U/S. 2. (U/S) EDD is consistent with Clinically established EDD of 06/09/2021. Recommendations: 1.Clinical correlation with the patient's History and Physical Exam. Gweneth Dimitri, RT The ultrasound images and findings were reviewed by me and I agree with the above report. Finis Bud, M.D. 11/19/2020 1:09 PM    Assessment:    Missed abortion  Plan:   Patient scheduled for surgical intervention with Suction Dilation and Curettage. Risks of surgery including bleeding, infection, injury to surrounding organs, need for additional procedures, possibility of intrauterine scarring which may impair future fertility, risk of retained products which may require further management and other postoperative/anesthesia complications have been explained to the patient. Preop testing ordered. Has been NPO since midnight.   Rubie Maid, MD  [facility]

## 2020-12-07 ENCOUNTER — Ambulatory Visit (INDEPENDENT_AMBULATORY_CARE_PROVIDER_SITE_OTHER): Payer: BC Managed Care – PPO | Admitting: Obstetrics and Gynecology

## 2020-12-07 ENCOUNTER — Ambulatory Visit
Admission: RE | Admit: 2020-12-07 | Payer: BC Managed Care – PPO | Source: Home / Self Care | Admitting: Obstetrics and Gynecology

## 2020-12-07 ENCOUNTER — Encounter: Payer: Self-pay | Admitting: Obstetrics and Gynecology

## 2020-12-07 ENCOUNTER — Other Ambulatory Visit: Payer: Self-pay

## 2020-12-07 ENCOUNTER — Encounter: Admission: RE | Payer: Self-pay | Source: Home / Self Care

## 2020-12-07 VITALS — BP 117/87 | HR 122 | Ht 61.0 in | Wt 160.9 lb

## 2020-12-07 DIAGNOSIS — R202 Paresthesia of skin: Secondary | ICD-10-CM | POA: Diagnosis not present

## 2020-12-07 DIAGNOSIS — R000 Tachycardia, unspecified: Secondary | ICD-10-CM

## 2020-12-07 DIAGNOSIS — R200 Anesthesia of skin: Secondary | ICD-10-CM | POA: Diagnosis not present

## 2020-12-07 DIAGNOSIS — O039 Complete or unspecified spontaneous abortion without complication: Secondary | ICD-10-CM

## 2020-12-07 LAB — CYTOLOGY - PAP
Comment: NEGATIVE
Diagnosis: UNDETERMINED — AB
High risk HPV: NEGATIVE

## 2020-12-07 SURGERY — DILATION AND EVACUATION, UTERUS
Anesthesia: General

## 2020-12-07 MED ORDER — ONDANSETRON HCL 4 MG/2ML IJ SOLN
INTRAMUSCULAR | Status: AC
  Filled 2020-12-07: qty 2

## 2020-12-07 MED ORDER — PROPOFOL 10 MG/ML IV BOLUS
INTRAVENOUS | Status: AC
  Filled 2020-12-07: qty 20

## 2020-12-07 MED ORDER — LIDOCAINE HCL (PF) 2 % IJ SOLN
INTRAMUSCULAR | Status: AC
  Filled 2020-12-07: qty 5

## 2020-12-07 MED ORDER — MIDAZOLAM HCL 2 MG/2ML IJ SOLN
INTRAMUSCULAR | Status: AC
  Filled 2020-12-07: qty 2

## 2020-12-07 MED ORDER — FENTANYL CITRATE (PF) 100 MCG/2ML IJ SOLN
INTRAMUSCULAR | Status: AC
  Filled 2020-12-07: qty 2

## 2020-12-07 MED ORDER — DEXAMETHASONE SODIUM PHOSPHATE 10 MG/ML IJ SOLN
INTRAMUSCULAR | Status: AC
  Filled 2020-12-07: qty 1

## 2020-12-07 NOTE — Progress Notes (Signed)
GYNECOLOGY PROGRESS NOTE  Subjective:    Patient ID: Darlene Moreno, female    DOB: 1986/04/01, 35 y.o.   MRN: [id number]  HPI  Patient is a 35 y.o. G42P1011 female who presents for pre-operative examination for scheduled Suction Dilation and Evacuation this afternoon for missed abortion at [redacted] weeks gestation. She is accompanied by her significant other. Patient reports that she began bleeding on Friday with passage of a large clot. Passed what looked like tissue products on between Saturday and Sunday.  Reports bleeding has slowed down, now only changing pads every 3-4 hours and cramping has decreased in intensity.  She reports that she took a picture of what she passed.  Also had her sister check in on her, who is a Marine scientist.   Of note, she has been noting some anxiety regarding this recent situation. Is reporting some left arm tingling beginning this morning. Unsure if this is anxiety related, or if there is something else going on.   The following portions of the patient's history were reviewed and updated as appropriate:   She  has a past medical history of Wears contact lenses.   She  has a past surgical history that includes No past surgeries and Excision mass neck (N/A, 10/07/2020).   Her family history includes Diabetes in her father; Hypertension in her mother.   She  reports that she has never smoked. She has never used smokeless tobacco. She reports previous alcohol use. She reports that she does not use drugs.   Current Outpatient Medications on File Prior to Visit  Medication Sig Dispense Refill  . Prenatal Vit-Fe Fumarate-FA (MULTIVITAMIN-PRENATAL) 27-0.8 MG TABS tablet Take 1 tablet by mouth daily at 12 noon.     No current facility-administered medications on file prior to visit.   She is allergic to amoxicillin..  Review of Systems Pertinent items noted in HPI and remainder of comprehensive ROS otherwise negative.   Objective:   Blood pressure 117/87, pulse (!)  122, height 5\' 1"  (1.549 m), weight 160 lb 14.4 oz (73 kg), last menstrual period 09/02/2020. General appearance: alert and no distress  Lungs: clear to auscultation bilaterally Heart: regular rhythm, S1, S2 normal, no murmur, click, rub or gallop. Tachycardia  Abdomen: soft, non-tender; bowel sounds normal; no masses,  no organomegaly Pelvic: deferred Extremities: extremities normal, atraumatic, no cyanosis or edema Neurologic: Grossly normal   Labs:  Initial Prenatal on 12/03/2020  Component Date Value Ref Range Status  . ABO Grouping 11/20/2020 O   Final  . Rh Factor 11/20/2020 Positive   Final   Comment: Please note: Prior records for this patient's ABO / Rh type are not available for additional verification.     Imaging: Bedside sono performed today, endometrium thickened, ~ 1.1 cm, no evidence of IUP. Possible small area of products of conception noted at cervical canal (heterogeneous material present).    US OB Comp Less 14 Wks Patient Name: Darlene Moreno DOB: 04-06-1986 MRN: [id number] ULTRASOUND REPORT  Location: [facility] Date of Service: 12/03/2020   Indications: Viability  Findings:  Darlene Moreno intrauterine pregnancy is visualized with a CRL consistent with  [redacted]w[redacted]d gestation, giving an (U/S) EDD of 06/23/2021.  FHR: no fetal heart tones.  CRL measurement: 423.8 mm Yolk sac is visualized and appears normal and early anatomy is normal. Amnion: not visualized   Right Ovary is normal in appearance. Left Ovary is normal appearance. Corpus luteal cyst:  is not visualized Survey of the adnexa demonstrates  no adnexal masses. There is no free peritoneal fluid in the cul de sac.  Impression: 1. [redacted]w[redacted]d  Singleton Intrauterine pregnancy by U/S. 2. (U/S) EDD is consistent with Clinically established EDD of 06/23/2021. 3. No fetal heart tones seen.  Recommendations: 1.Clinical correlation with the patient's History and Physical Exam.  Jenine M. Albertine Grates      RDMS  I have reviewed this study and agree with documented findings.   Rubie Maid, MD [facility]   Assessment:   Complete miscarriage Numbness and tingling of arm Tachycardia   Plan:   1. Miscarriage -  Reviewed patient's photos of passed material, and based on ultrasound it does appear that patient has essentially passed products of conception. Some residual material still noted in cervical canal but will likely pass over the next week spontaneously.  Bleeding and cramping have already begun to improve. Will have patient f/u next week to f/u bleeding. If bleeding has stopped or down to spotting, likely has completed process. If still bleeding like menstrual flow, consider a repeat ultrasound/HCG level.  Will cancel her surgery for today.  2. Numbness and tingling of left arm, tachycardia - may be secondary to anxiety, or could be due to recent blood loss and anemia. Will check H/H today. Advised that she may need to begin iron supplements. Anxiety was mostly surrounding the events of the miscarriage. Advised that she has essentially completed the process and no longer needs surgical intervention. Will follow up with patient in 1 week to reassess symptoms. Can also give information for local support groups. Also can consider other interventions such as counseling, medications at next visit if abnormal/atypical grief present.    A total of 20 minutes were spent face-to-face with the patient during this encounter and over half of that time dealt with counseling and performance of bedside imaging and coordination of care.   Rubie Maid, MD [facility]

## 2020-12-07 NOTE — Patient Instructions (Signed)
Managing Pregnancy Loss Pregnancy loss can happen any time during a pregnancy. Often the cause is not known. It is rarely because of anything you did. Pregnancy loss in early pregnancy (during the first trimester) is called a miscarriage. This type of pregnancy loss is the most common. Pregnancy loss that happens after 20 weeks of pregnancy is called fetal demise if the baby's heart stops beating before birth. Fetal demise is much less common. Some women experience spontaneous labor shortly after fetal demise resulting in a stillborn birth (stillbirth). Any pregnancy loss can be devastating. You will need to recover both physically and emotionally. Most women are able to get pregnant again after a pregnancy loss and deliver a healthy baby. How to manage emotional recovery Pregnancy loss is very hard emotionally. You may feel many different emotions while you grieve. You may feel sad and angry. You may also feel guilty. It is normal to have periods of crying. Emotional recovery can take longer than physical recovery. It is different for everyone. Taking these steps can help you in managing this loss:  Remember that it is unlikely you did anything to cause the pregnancy loss.  Share your thoughts and feelings with friends, family, and your partner. Remember that your partner is also recovering emotionally.  Make sure you have a good support system. Do not spend too much time alone.  Meet with a pregnancy loss counselor or join a pregnancy loss support group.  Get enough sleep and eat a healthy diet. Return to regular exercise when you have recovered physically.  Do not use drugs or alcohol to manage your emotions.  Consider seeing a mental health professional to help you recover emotionally.  Ask a friend or loved one to help you decide what to do with any clothing and nursery items you received for your baby. In the case of a stillbirth, many women benefit from taking additional steps in the  grieving process. You may want to:  Hold your baby after the birth.  Name your baby.  Request a birth certificate.  Create a keepsake such as handprints or footprints.  Dress your baby and have a picture taken.  Make funeral arrangements.  Ask for a baptism or blessing. Hospitals have staff members who can help you with all these arrangements.   How to recognize emotional stress It is normal to have emotional stress after a pregnancy loss. But emotional stress that lasts a long time or becomes severe requires treatment. Watch out for these signs of severe emotional stress:  Sadness, anger, or guilt that is not going away and is interfering with your normal activities.  Relationship problems that have occurred or gotten worse since the pregnancy loss.  Signs of depression that last longer than 2 weeks. These may include: ? Sadness. ? Anxiety. ? Hopelessness. ? Loss of interest in activities you enjoy. ? Inability to concentrate. ? Trouble sleeping or sleeping too much. ? Loss of appetite or overeating. ? Thoughts of death or of hurting yourself. Follow these instructions at home:  Take over-the-counter and prescription medicines only as told by your health care provider.  Rest at home until your energy level returns. Return to your normal activities as told by your health care provider. Ask your health care provider what activities are safe for you.  When you are ready, meet with your health care provider to discuss steps to take for a future pregnancy.  Keep all follow-up visits as told by your health care provider. This is  important. Where to find support  To help you and your partner with the process of grieving, talk with your health care provider or seek counseling.  Consider meeting with others who have experienced pregnancy loss. Ask your health care provider about support groups and resources. Where to find more information  U.S. Department of Health and [place] representative on Women's Health: [place]s.tn  American Pregnancy Association: www.americanpregnancy.org Contact a health care provider if:  You continue to experience grief, sadness, or lack of motivation for everyday activities, and those feelings do not improve over time.  You are struggling to recover emotionally, especially if you are using alcohol or substances to help. Get help right away if:  You have thoughts of hurting yourself or others. If you ever feel like you may hurt yourself or others, or have thoughts about taking your own life, get help right away. You can go to your nearest emergency department or call:  Your local emergency services (911 in the [place]  A suicide crisis helpline, such as the [place] at (661)519-6106. This is open 24 hours a day. Summary  Any pregnancy loss can be difficult physically and emotionally.  You may experience many different emotions while you grieve. Emotional recovery can last longer than physical recovery.  It is normal to have emotional stress after a pregnancy loss. But emotional stress that lasts a long time or becomes severe requires treatment.  See your health care provider if you are struggling emotionally after a pregnancy loss. This information is not intended to replace advice given to you by your health care provider. Make sure you discuss any questions you have with your health care provider. Document Revised: 01/23/2019 Document Reviewed: 12/14/2017 Elsevier Patient Education  2021 [place].    Miscarriage A miscarriage is the loss of a pregnancy before the 20th week of pregnancy. Sometimes, a pregnancy ends before a woman knows that she is pregnant. If you lose a pregnancy, talk with your doctor about:  Questions you have about the loss of your baby.  How to work through your grief.  Plans for future pregnancy. What are the causes? Many times, the cause of this  condition is not known. What increases the risk? These things may make a pregnant woman more likely to lose a pregnancy: Certain health conditions  Conditions that affect hormones, such as: ? Thyroid disease. ? Polycystic ovary syndrome.  Diabetes.  A disease that causes the body's disease-fighting system to attack itself by mistake.  Infections.  Bleeding problems.  Being very overweight. Lifestyle factors  Using products that have tobacco or nicotine in them.  Being around tobacco smoke.  Having alcohol.  Having a lot of caffeine.  Using drugs. Problems with reproductive organs or parts  Having a cervix that opens and thins before your due date. The cervix is the lowest part of your womb.  Having Asherman syndrome, which leads to: ? Scars in the womb. ? The womb being abnormal in shape.  Growths (fibroids) in the womb.  Problems in the body that are present at birth.  Infection of the cervix or womb. Personal or health history  Injury.  Having lost a pregnancy before.  Being younger than age 51 or older than age 48.  Being around a harmful substance, such as radiation.  Having lead or other heavy metals in: ? Things you eat or drink. ? The air around you.  Using certain medicines. What are the signs or symptoms?  Blood  or spots of blood coming from the vagina. You may also have cramps or pain.  Pain or cramps in the belly or low back.  Fluid or tissue coming out of the vagina. How is this treated? Sometimes, treatment is not needed. If you need treatment, you may be treated with:  A procedure to open the cervix more and take tissue out of the womb.  Medicines. You may get a shot of medicine called Rho(D) immune globulin. Follow these instructions at home: Medicines  Take over-the-counter and prescription medicines only as told by your doctor.  If you were prescribed antibiotic medicine, take it as told by your doctor. Do not stop taking it  even if you start to feel better. Activity  Rest as told by your doctor. Ask your doctor what activities are safe for you.  Have someone help you at home during this time. General instructions  Watch how much tissue comes out of the vagina.  Watch the size of any blood clots that come out of the vagina.  Do not have sex or douche until your doctor says it is okay.  Do not put things, such as tampons, in your vagina until your doctor says it is okay.  To help you and your partner with grieving: ? Talk with your doctor. ? See a Social worker.  When you are ready, talk with your doctor about: ? Things to do for your health. ? How you can be healthy if you get pregnant again.  Keep all follow-up visits.   Where to find more information  The [employer] of Obstetricians and Gynecologists: acog.org  U.S. Department of Health and [place], systems of Women's Health: EverydayCosmetics.no Contact a doctor if:  You have a fever or chills.  There is bad-smelling fluid coming from the vagina.  You have more bleeding.  Tissue or clots of blood come out of your vagina. Get help right away if:  You have very bad cramps or pain in your back or belly.  You soak more than 2 large pads in an hour for more than 2 hours.  You get light-headed or weak.  You faint.  You feel sad, and you have sad thoughts a lot of the time.  You think about hurting yourself. Get help right awayif you feel like you may hurt yourself or others, or have thoughts about taking your own life. Go to your nearest emergency room or:  Call your local emergency services (911 in the [place]  Call the [employer] [facility] at 571-114-7378. This is open 24 hours a day.  Text the Crisis Text Line at (512)059-2846. Summary  A miscarriage is the loss of a pregnancy before the 20th week of pregnancy. Sometimes, a pregnancy ends before a woman knows that she is pregnant.  Follow  instructions from your doctor about medicines and activity.  To help you and your partner with grieving, talk with your doctor or a counselor.  Keep all follow-up visits. This information is not intended to replace advice given to you by your health care provider. Make sure you discuss any questions you have with your health care provider. Document Revised: 04/03/2020 Document Reviewed: 04/03/2020 Elsevier Patient Education  [place].

## 2020-12-08 LAB — HEMOGLOBIN AND HEMATOCRIT, BLOOD
Hematocrit: 38.1 % (ref 34.0–46.6)
Hemoglobin: 12.5 g/dL (ref 11.1–15.9)

## 2020-12-13 LAB — DRUG PROFILE, UR, 9 DRUGS (LABCORP)
Amphetamines, Urine: NEGATIVE ng/mL
Barbiturate Quant, Ur: NEGATIVE ng/mL
Benzodiazepine Quant, Ur: NEGATIVE ng/mL
Cannabinoid Quant, Ur: NEGATIVE ng/mL
Cocaine (Metab.): NEGATIVE ng/mL
Methadone Screen, Urine: NEGATIVE ng/mL
Opiate Quant, Ur: NEGATIVE ng/mL
PCP Quant, Ur: NEGATIVE ng/mL
Propoxyphene: NEGATIVE ng/mL

## 2020-12-13 LAB — NICOTINE SCREEN, URINE: Cotinine Ql Scrn, Ur: NEGATIVE ng/mL

## 2020-12-14 ENCOUNTER — Encounter: Payer: Self-pay | Admitting: Certified Nurse Midwife

## 2020-12-14 ENCOUNTER — Other Ambulatory Visit: Payer: Self-pay

## 2020-12-14 ENCOUNTER — Ambulatory Visit (INDEPENDENT_AMBULATORY_CARE_PROVIDER_SITE_OTHER): Payer: BC Managed Care – PPO | Admitting: Certified Nurse Midwife

## 2020-12-14 VITALS — BP 101/74 | HR 103 | Ht 61.0 in | Wt 160.2 lb

## 2020-12-14 DIAGNOSIS — Z8759 Personal history of other complications of pregnancy, childbirth and the puerperium: Secondary | ICD-10-CM | POA: Diagnosis not present

## 2020-12-14 DIAGNOSIS — R87610 Atypical squamous cells of undetermined significance on cytologic smear of cervix (ASC-US): Secondary | ICD-10-CM | POA: Diagnosis not present

## 2020-12-14 DIAGNOSIS — Z1331 Encounter for screening for depression: Secondary | ICD-10-CM | POA: Diagnosis not present

## 2020-12-14 NOTE — Progress Notes (Signed)
GYN ENCOUNTER NOTE  Subjective:       Darlene Moreno is a 35 y.o. G66P1011 female here follow up after miscarriage on 12/04/2020.   Reports light vaginal bleeding, no concerns at this time.   Denies difficulty breathing or respiratory distress, chest pain, abdominal pain, dysuria, and leg pain or swelling.    Gynecologic History  Patient's last menstrual period was 09/02/2020 (exact date).  Contraception: condoms  Last Pap: 11/2020. Results were: abnormal, ASC-US, HPV negative  Obstetric History  OB History  Gravida Para Term Preterm AB Living  2 1 1   1 1   SAB IAB Ectopic Multiple Live Births          1    # Outcome Date GA Lbr Len/2nd Weight Sex Delivery Anes PTL Lv  2 AB 12/05/20 [redacted]w[redacted]d         1 Term 03/02/10   7 lb 2 oz (3.232 kg) M Vag-Spont EPI N LIV    Past Medical History:  Diagnosis Date  . Wears contact lenses     Past Surgical History:  Procedure Laterality Date  . EXCISION MASS NECK N/A 10/07/2020   Procedure: EXCISION  NECK EPIDERMAL INCLUSION CYST;  Surgeon: Carloyn Manner, MD;  Location: [place];  Service: ENT;  Laterality: N/A;  Local    Current Outpatient Medications on File Prior to Visit  Medication Sig Dispense Refill  . Prenatal Vit-Fe Fumarate-FA (MULTIVITAMIN-PRENATAL) 27-0.8 MG TABS tablet Take 1 tablet by mouth daily at 12 noon.     No current facility-administered medications on file prior to visit.    Allergies  Allergen Reactions  . Amoxicillin Rash    Social History   Socioeconomic History  . Marital status: Single    Spouse name: Not on file  . Number of children: Not on file  . Years of education: Not on file  . Highest education level: Not on file  Occupational History  . Not on file  Tobacco Use  . Smoking status: Never Smoker  . Smokeless tobacco: Never Used  Vaping Use  . Vaping Use: Never used  Substance and Sexual Activity  . Alcohol use: Not Currently  . Drug use: Never  . Sexual activity: Yes     Birth control/protection: None  Other Topics Concern  . Not on file  Social History Narrative  . Not on file   Social Determinants of Health   Financial Resource Strain: Not on file  Food Insecurity: Not on file  Transportation Needs: Not on file  Physical Activity: Not on file  Stress: Not on file  Social Connections: Not on file  Intimate Partner Violence: Not on file    Family History  Problem Relation Age of Onset  . Hypertension Mother   . Diabetes Father   . Breast cancer Neg Hx   . Ovarian cancer Neg Hx   . Colon cancer Neg Hx     The following portions of the patient's history were reviewed and updated as appropriate: allergies, current medications, past family history, past medical history, past social history, past surgical history and problem list.  Review of Systems  ROS negative except as noted above. Information obtained from patient.   Objective:   BP 101/74   Pulse (!) 103   Ht 5\' 1"  (1.549 m)   Wt 160 lb 3.2 oz (72.7 kg)   LMP 09/02/2020 (Exact Date)   BMI 30.27 kg/m    CONSTITUTIONAL: Well-developed, well-nourished female in no acute distress.  PHYSICAL EXAM: Not indicated.   Depression screen [facility] 2/9 12/14/2020 12/03/2020  Decreased Interest 0 0  Down, Depressed, Hopeless 0 0  PHQ - 2 Score 0 0  Altered sleeping 1 -  Tired, decreased energy 1 -  Change in appetite 0 -  Feeling bad or failure about yourself  0 -  Trouble concentrating 0 -  Moving slowly or fidgety/restless 0 -  Suicidal thoughts 0 -  PHQ-9 Score 2 -  Difficult doing work/chores Not difficult at all -   GAD 7 : Generalized Anxiety Score 12/14/2020  Nervous, Anxious, on Edge 2  Control/stop worrying 0  Worry too much - different things 0  Trouble relaxing 0  Restless 0  Easily annoyed or irritable 1  Afraid - awful might happen 0  Total GAD 7 Score 3  Anxiety Difficulty Not difficult at all    Assessment:   1. History of spontaneous abortion   2. ASCUS of  cervix with negative high risk HPV   3. Depression screening negative   Plan:   Emotional support provided.   Discussed results of Pap smear and recommended follow up.   Plans condom use as pregnancy prevention until ready to try again.   Reviewed red flag symptoms and when to call.   RTC x 1 year for ANNUAL EXAM or sooner if needed.     Dani Gobble, CNM [facility], [facility] 12/14/20 2:41 PM

## 2020-12-14 NOTE — Patient Instructions (Signed)
Preventive Care 21-33 Years Old, Female Preventive care refers to lifestyle choices and visits with your health care provider that can promote health and wellness. This includes:  A yearly physical exam. This is also called an annual wellness visit.  Regular dental and eye exams.  Immunizations.  Screening for certain conditions.  Healthy lifestyle choices, such as: ? Eating a healthy diet. ? Getting regular exercise. ? Not using drugs or products that contain nicotine and tobacco. ? Limiting alcohol use. What can I expect for my preventive care visit? Physical exam Your health care provider may check your:  Height and weight. These may be used to calculate your BMI (body mass index). BMI is a measurement that tells if you are at a healthy weight.  Heart rate and blood pressure.  Body temperature.  Skin for abnormal spots. Counseling Your health care provider may ask you questions about your:  Past medical problems.  Family's medical history.  Alcohol, tobacco, and drug use.  Emotional well-being.  Home life and relationship well-being.  Sexual activity.  Diet, exercise, and sleep habits.  Work and work environment.  Access to firearms.  Method of birth control.  Menstrual cycle.  Pregnancy history. What immunizations do I need? Vaccines are usually given at various ages, according to a schedule. Your health care provider will recommend vaccines for you based on your age, medical history, and lifestyle or other factors, such as travel or where you work.   What tests do I need? Blood tests  Lipid and cholesterol levels. These may be checked every 5 years starting at age 20.  Hepatitis C test.  Hepatitis B test. Screening  Diabetes screening. This is done by checking your blood sugar (glucose) after you have not eaten for a while (fasting).  STD (sexually transmitted disease) testing, if you are at risk.  BRCA-related cancer screening. This may be  done if you have a family history of breast, ovarian, tubal, or peritoneal cancers.  Pelvic exam and Pap test. This may be done every 3 years starting at age 21. Starting at age 30, this may be done every 5 years if you have a Pap test in combination with an HPV test. Talk with your health care provider about your test results, treatment options, and if necessary, the need for more tests.   Follow these instructions at home: Eating and drinking  Eat a healthy diet that includes fresh fruits and vegetables, whole grains, lean protein, and low-fat dairy products.  Take vitamin and mineral supplements as recommended by your health care provider.  Do not drink alcohol if: ? Your health care provider tells you not to drink. ? You are pregnant, may be pregnant, or are planning to become pregnant.  If you drink alcohol: ? Limit how much you have to 0-1 drink a day. ? Be aware of how much alcohol is in your drink. In the [place] one drink equals one 12 oz bottle of beer (355 mL), one 5 oz glass of wine (148 mL), or one 1 oz glass of hard liquor (44 mL).   Lifestyle  Take daily care of your teeth and gums. Brush your teeth every morning and night with fluoride toothpaste. Floss one time each day.  Stay active. Exercise for at least 30 minutes 5 or more days each week.  Do not use any products that contain nicotine or tobacco, such as cigarettes, e-cigarettes, and chewing tobacco. If you need help quitting, ask your health care provider.  Do not   use drugs.  If you are sexually active, practice safe sex. Use a condom or other form of protection to prevent STIs (sexually transmitted infections).  If you do not wish to become pregnant, use a form of birth control. If you plan to become pregnant, see your health care provider for a prepregnancy visit.  Find healthy ways to cope with stress, such as: ? Meditation, yoga, or listening to music. ? Journaling. ? Talking to a trusted  person. ? Spending time with friends and family. Safety  Always wear your seat belt while driving or riding in a vehicle.  Do not drive: ? If you have been drinking alcohol. Do not ride with someone who has been drinking. ? When you are tired or distracted. ? While texting.  Wear a helmet and other protective equipment during sports activities.  If you have firearms in your house, make sure you follow all gun safety procedures.  Seek help if you have been physically or sexually abused. What's next?  Go to your health care provider once a year for an annual wellness visit.  Ask your health care provider how often you should have your eyes and teeth checked.  Stay up to date on all vaccines. This information is not intended to replace advice given to you by your health care provider. Make sure you discuss any questions you have with your health care provider. Document Revised: 05/31/2020 Document Reviewed: 06/14/2018 Elsevier Patient Education  2021 Elsevier Inc.  

## 2020-12-14 NOTE — Progress Notes (Signed)
Pt present for follow up visit after miscarriage. Pt stated that she was doing well no problems.

## 2021-03-19 ENCOUNTER — Other Ambulatory Visit: Payer: BC Managed Care – PPO

## 2021-03-19 ENCOUNTER — Other Ambulatory Visit: Payer: Self-pay

## 2021-03-19 ENCOUNTER — Telehealth: Payer: Self-pay | Admitting: Certified Nurse Midwife

## 2021-03-19 DIAGNOSIS — O209 Hemorrhage in early pregnancy, unspecified: Secondary | ICD-10-CM | POA: Diagnosis not present

## 2021-03-19 DIAGNOSIS — Z8759 Personal history of other complications of pregnancy, childbirth and the puerperium: Secondary | ICD-10-CM

## 2021-03-19 NOTE — Telephone Encounter (Signed)
Telephone call to patient, verified full name and date of birth.   Missed period, LMP: 02/09/2021 with positive home pregnancy test.   Started bleeding last night, light red bleeding without clots.   No abdominal pain or fever.   Labs today, see orders.   Reviewed red flag symptoms and when to call.    Dani Gobble, CNM [facility], [facility] 03/19/21 1:24 PM

## 2021-03-19 NOTE — Telephone Encounter (Signed)
Patient states she did have a period in April and she has started bleeding last night.  Her at home pregnancy test was positive and she's concerned that she is either losing the baby or maybe ad a false positive.  Patient is scheduled nest Thursday to have pregnancy test done. Please advise

## 2021-03-20 LAB — PROGESTERONE: Progesterone: 1.9 ng/mL

## 2021-03-20 LAB — BETA HCG QUANT (REF LAB): hCG Quant: 189 m[IU]/mL

## 2021-03-25 ENCOUNTER — Encounter: Payer: Self-pay | Admitting: Certified Nurse Midwife

## 2021-03-25 ENCOUNTER — Other Ambulatory Visit: Payer: Self-pay

## 2021-03-25 ENCOUNTER — Ambulatory Visit (INDEPENDENT_AMBULATORY_CARE_PROVIDER_SITE_OTHER): Payer: BC Managed Care – PPO | Admitting: Certified Nurse Midwife

## 2021-03-25 VITALS — BP 137/84 | HR 105 | Resp 16 | Ht 61.0 in | Wt 172.8 lb

## 2021-03-25 DIAGNOSIS — Z3202 Encounter for pregnancy test, result negative: Secondary | ICD-10-CM

## 2021-03-25 DIAGNOSIS — Z3200 Encounter for pregnancy test, result unknown: Secondary | ICD-10-CM

## 2021-03-25 DIAGNOSIS — Z3009 Encounter for other general counseling and advice on contraception: Secondary | ICD-10-CM | POA: Diagnosis not present

## 2021-03-25 DIAGNOSIS — R5383 Other fatigue: Secondary | ICD-10-CM

## 2021-03-25 DIAGNOSIS — Z8759 Personal history of other complications of pregnancy, childbirth and the puerperium: Secondary | ICD-10-CM

## 2021-03-25 DIAGNOSIS — R635 Abnormal weight gain: Secondary | ICD-10-CM

## 2021-03-25 LAB — POCT URINE PREGNANCY: Preg Test, Ur: NEGATIVE

## 2021-03-25 NOTE — Progress Notes (Signed)
GYN ENCOUNTER NOTE  Subjective:       Darlene Moreno is a 35 y.o. G79P1011 female is here for gynecologic evaluation of the following issues:  1. Vaginal bleeding and abdominal cramping after positive pregnancy test  Denies difficulty breathing or respiratory distress, chest pain, abdominal pain, excessive vaginal bleeding, dysuria, and leg pain or swelling.    Gynecologic History  Patient's last menstrual period was 02/08/2021.  Contraception: none  Last Pap: 11/2020. Results were: abnormal, ASC-US, Negative HPV  Obstetric History  OB History  Gravida Para Term Preterm AB Living  2 1 1   1 1   SAB IAB Ectopic Multiple Live Births          1    # Outcome Date GA Lbr Len/2nd Weight Sex Delivery Anes PTL Lv  2 AB 12/05/20 [redacted]w[redacted]d         1 Term 03/02/10   7 lb 2 oz (3.232 kg) M Vag-Spont EPI N LIV    Past Medical History:  Diagnosis Date   Wears contact lenses     Past Surgical History:  Procedure Laterality Date   EXCISION MASS NECK N/A 10/07/2020   Procedure: EXCISION  NECK EPIDERMAL INCLUSION CYST;  Surgeon: Carloyn Manner, MD;  Location: [place];  Service: ENT;  Laterality: N/A;  Local    Current Outpatient Medications on File Prior to Visit  Medication Sig Dispense Refill   Prenatal Vit-Fe Fumarate-FA (MULTIVITAMIN-PRENATAL) 27-0.8 MG TABS tablet Take 1 tablet by mouth daily at 12 noon.     No current facility-administered medications on file prior to visit.    Allergies  Allergen Reactions   Amoxicillin Rash    Social History   Socioeconomic History   Marital status: Single    Spouse name: Not on file   Number of children: Not on file   Years of education: Not on file   Highest education level: Not on file  Occupational History   Not on file  Tobacco Use   Smoking status: Never   Smokeless tobacco: Never  Vaping Use   Vaping Use: Never used  Substance and Sexual Activity   Alcohol use: Not Currently   Drug use: Never   Sexual  activity: Yes    Birth control/protection: None  Other Topics Concern   Not on file  Social History Narrative   Not on file   Social Determinants of Health   Financial Resource Strain: Not on file  Food Insecurity: Not on file  Transportation Needs: Not on file  Physical Activity: Not on file  Stress: Not on file  Social Connections: Not on file  Intimate Partner Violence: Not on file    Family History  Problem Relation Age of Onset   Hypertension Mother    Diabetes Father    Breast cancer Neg Hx    Ovarian cancer Neg Hx    Colon cancer Neg Hx     The following portions of the patient's history were reviewed and updated as appropriate: allergies, current medications, past family history, past medical history, past social history, past surgical history and problem list.  Review of Systems  ROS negative except as noted above. Information obtained from patient.   Objective:   BP 137/84   Pulse (!) 105   Resp 16   Ht 5\' 1"  (1.549 m)   Wt 172 lb 12.8 oz (78.4 kg)   BMI 32.65 kg/m   CONSTITUTIONAL: Well-developed, well-nourished female in no acute distress.   Recent Results (from  the past 2160 hour(s))  Beta HCG, Quant     Status: None   Collection Time: 03/19/21  2:48 PM  Result Value Ref Range   hCG Quant 189 mIU/mL    Comment:                      Female (Non-pregnant)    0 -     5                             (Postmenopausal)  0 -     8                      Female (Pregnant)                      Weeks of Gestation                              3                6 -    71                              4               10 -   750                              5              217 -  7138                              6              158 - 31795                              7             [id number] -[id number]                              8            [id number] -[id number]                              8            [id number] -[id number]                             67            [id number] -[id number]                              83            [id number]                             14  [place] Roche E CLIA methodology   Progesterone     Status: None   Collection Time: 03/19/21  2:48 PM  Result Value Ref Range   Progesterone 1.9 ng/mL    Comment:                      Follicular phase       0.1 -   0.9                      Luteal phase           1.8 -  23.9                      Ovulation phase        0.1 -  12.0                      Pregnant                         First trimester    11.0 -  44.3                         Second trimester   25.4 -  83.3                         Third trimester    58.7 - 214.0                      Postmenopausal         0.0 -   0.1   POCT urine pregnancy     Status: Normal   Collection Time: 03/25/21  3:58 PM  Result Value Ref Range   Preg Test, Ur Negative Negative  TSH+T4F+T3Free     Status: None   Collection Time: 03/25/21  4:26 PM  Result Value Ref Range   TSH 1.410 0.450 - 4.500 uIU/mL   T3, Free 3.0 2.0 - 4.4 pg/mL   Free T4 1.10 0.82 - 1.77 ng/dL     Assessment:   1. Possible pregnancy, not yet confirmed  - POCT urine pregnancy  2. Negative pregnancy test  - TSH+T4F+T3Free  3. History of miscarriage  - TSH+T4F+T3Free  4. Encounter for counseling regarding contraception   5. Other fatigue  - TSH+T4F+T3Free  6. Weight gain  - TSH+T4F+T3Free    Plan:   Labs discussed with patient, condolences offered.    Patient wishes to start birth control and defer pregnancy at this time.   Samples of Lo Loestrin given.   Reviewed red flag symptoms and when to call.   RTC x 3 months for follow up or sooner if needed.    Dani Gobble, CNM [facility], [facility] 03/26/21 1:10  PM

## 2021-03-25 NOTE — Patient Instructions (Signed)
Norethindrone Acetate; Ethinyl Estradiol; Ferrous Fumarate Capsules or Tablets What is this medicine? NORETHINDRONE; ETHINYL ESTRADIOL; FERROUS FUMARATE (nor eth IN drone; ETH in il es tra DYE ole; FER [place] FUE ma rate) is an oral contraceptive. The products combine two types of female hormones, an estrogen and a progestin. These products prevent ovulation and pregnancy. This medicine may be used for other purposes; ask your health care provider or pharmacist if you have questions. COMMON BRAND NAME(S): [place]a [address] 1/20, [address] [place] [address], [address] Fe, [place], [place], [place] 24 Fe, [place] Fe 1.5/30, [place] Fe 1/20, [place]iley 24 Fe, [place]iley Fe 1.5/30, [place] Fe 1.5/30, Junel Fe 1/20, [place] Fe 24, [place] Fe, Lo Loestrin Fe, Loestrin 24 Fe, Loestrin FE 1.5/30, Loestrin FE 1/20, [place] 24 Fe, [place], Microgestin 24 Fe, Microgestin Fe 1.5/30, Microgestin Fe 1/20, Tarina 24 Fe, Tarina Fe 1/20, [place], [place], Tilia Fe, Tri-Legest Fe What should I tell my health care provider before I take this medicine? They need to know if you have any of these conditions: abnormal vaginal bleeding blood vessel disease or blood clots breast, cervical, endometrial, ovarian, liver, or uterine cancer diabetes gallbladder disease having surgery heart disease or recent heart attack high blood pressure high cholesterol or triglycerides history of irregular heartbeat or heart valve problems kidney disease liver disease migraine headaches protein C deficiency protein S deficiency recently had a baby, miscarriage, or abortion stroke systemic lupus erythematosus (SLE) tobacco smoker an unusual or allergic reaction to estrogens, progestins, other medicines, foods, dyes, or preservatives pregnant or trying to get pregnant breast-feeding How should I use this medicine? Take this medicine by mouth. To reduce nausea, this medicine may be taken with food. Follow the directions on the prescription label.  Take this medicine at the same time each day and in the order directed on the package. Do not take your medicine more often than directed. A patient package insert for the product will be given with each prescription and refill. Read this sheet carefully each time. The sheet may change frequently. Contact your pediatrician regarding the use of this medicine in children. Special care may be needed. This medicine has been used in female children who have started having menstrual periods. Overdosage: If you think you have taken too much of this medicine contact a poison control center or emergency room at once. NOTE: This medicine is only for you. Do not share this medicine with others. What if I miss a dose? If you miss a dose, refer to the patient information sheet you received with your medication for direction. If you miss more than one pill, this medication may not be as effective, and you may need to use another form of birth control. What may interact with this medicine? Do not take this medicine with the following medication: dasabuvir; ombitasvir; paritaprevir; ritonavir ombitasvir; paritaprevir; ritonavir This medicine may also interact with the following medications: acetaminophen antibiotics or medicines for infections, especially rifampin, rifabutin, rifapentine, and griseofulvin, and possibly penicillins or tetracyclines aprepitant ascorbic acid (vitamin C) atorvastatin barbiturate medicines, such as phenobarbital bosentan carbamazepine caffeine clofibrate cyclosporine dantrolene doxercalciferol felbamate grapefruit juice hydrocortisone medicines for anxiety or sleeping problems, such as diazepam or temazepam medicines for diabetes, including pioglitazone mineral oil modafinil mycophenolate nefazodone oxcarbazepine phenytoin prednisolone ritonavir or other medicines for HIV infection or AIDS rosuvastatin selegiline soy isoflavones supplements [facility] [facility]  wort tamoxifen or raloxifene theophylline thyroid hormones topiramate warfarin This list may not describe all possible interactions. Give your health care provider a  list of all the medicines, herbs, non-prescription drugs, or dietary supplements you use. Also tell them if you smoke, drink alcohol, or use illegal drugs. Some items may interact with your medicine. What should I watch for while using this medicine? Visit your doctor or health care professional for regular checks on your progress. You will need a regular breast and pelvic exam and Pap smear while on this medicine. Use an additional method of contraception during the first cycle that you take these tablets. If you have any reason to think you are pregnant, stop taking this medicine right away and contact your doctor or health care professional. If you are taking this medicine for hormone related problems, it may take several cycles of use to see improvement in your condition. Smoking increases the risk of getting a blood clot or having a stroke while you are taking birth control pills, especially if you are more than 33 years old. You are strongly advised not to smoke. This medicine can make your body retain fluid, making your fingers, hands, or ankles swell. Your blood pressure can go up. Contact your doctor or health care professional if you feel you are retaining fluid. This medicine can make you more sensitive to the sun. Keep out of the sun. If you cannot avoid being in the sun, wear protective clothing and use sunscreen. Do not use sun lamps or tanning beds/booths. If you wear contact lenses and notice visual changes, or if the lenses begin to feel uncomfortable, consult your eye care specialist. In some women, tenderness, swelling, or minor bleeding of the gums may occur. Notify your dentist if this happens. Brushing and flossing your teeth regularly may help limit this. See your dentist regularly and inform your dentist of the  medicines you are taking. If you are going to have elective surgery, you may need to stop taking this medicine before the surgery. Consult your health care professional for advice. This medicine does not protect you against HIV infection (AIDS) or any other sexually transmitted diseases. What side effects may I notice from receiving this medicine? Side effects that you should report to your doctor or health care professional as soon as possible: allergic reactions such as skin rash or itching, hives, swelling of the lips, mouth, tongue, or throat breast tissue changes or discharge dark patches of skin on your forehead, cheeks, upper lip, and chin depression high blood pressure migraines or severe, sudden headaches signs and symptoms of a blood clot such as breathing problems; changes in vision; chest pain; severe, sudden headache; pain, swelling, warmth in the leg; trouble speaking; sudden numbness or weakness of the face, arm or leg stomach pain symptoms of vaginal infection like itching, irritation or unusual discharge yellowing of the eyes or skin Side effects that usually do not require medical attention (report these to your doctor or health care professional if they continue or are bothersome): acne breast pain, tenderness irregular vaginal bleeding or spotting, particularly during the first month of use mild headache nausea weight gain (slight) This list may not describe all possible side effects. Call your doctor for medical advice about side effects. You may report side effects to FDA at 1-800-FDA-1088. Where should I keep my medicine? Keep out of the reach of children. Store at room temperature between 15 and 30 degrees C (59 and 86 degrees F). Throw away any unused medicine after the expiration date. NOTE: This sheet is a summary. It may not cover all possible information. If you have questions  about this medicine, talk to your doctor, pharmacist, or health care provider.  2021  Elsevier/Gold Standard (2020-08-24 12:27:45)

## 2021-03-26 LAB — TSH+T4F+T3FREE
Free T4: 1.1 ng/dL (ref 0.82–1.77)
T3, Free: 3 pg/mL (ref 2.0–4.4)
TSH: 1.41 u[IU]/mL (ref 0.450–4.500)

## 2021-04-24 ENCOUNTER — Observation Stay
Admission: EM | Admit: 2021-04-24 | Discharge: 2021-04-25 | Disposition: A | Discharge status: Home / Self Care | Payer: BC Managed Care – PPO | Attending: Student | Admitting: Student

## 2021-04-24 ENCOUNTER — Observation Stay: Payer: BC Managed Care – PPO

## 2021-04-24 ENCOUNTER — Other Ambulatory Visit: Payer: Self-pay

## 2021-04-24 DIAGNOSIS — R202 Paresthesia of skin: Secondary | ICD-10-CM | POA: Diagnosis present

## 2021-04-24 DIAGNOSIS — R200 Anesthesia of skin: Secondary | ICD-10-CM | POA: Diagnosis not present

## 2021-04-24 DIAGNOSIS — Z20822 Contact with and (suspected) exposure to covid-19: Secondary | ICD-10-CM | POA: Diagnosis not present

## 2021-04-24 DIAGNOSIS — F419 Anxiety disorder, unspecified: Secondary | ICD-10-CM | POA: Insufficient documentation

## 2021-04-24 DIAGNOSIS — G35 Multiple sclerosis: Secondary | ICD-10-CM | POA: Diagnosis not present

## 2021-04-24 LAB — CBC
HCT: 42 % (ref 36.0–46.0)
Hemoglobin: 13.2 g/dL (ref 12.0–15.0)
MCH: 24.3 pg — ABNORMAL LOW (ref 26.0–34.0)
MCHC: 31.4 g/dL (ref 30.0–36.0)
MCV: 77.3 fL — ABNORMAL LOW (ref 80.0–100.0)
Platelets: 283 10*3/uL (ref 150–400)
RBC: 5.43 MIL/uL — ABNORMAL HIGH (ref 3.87–5.11)
RDW: 16 % — ABNORMAL HIGH (ref 11.5–15.5)
WBC: 6.4 10*3/uL (ref 4.0–10.5)
nRBC: 0 % (ref 0.0–0.2)

## 2021-04-24 LAB — BASIC METABOLIC PANEL
Anion gap: 7 (ref 5–15)
BUN: 15 mg/dL (ref 6–20)
CO2: 24 mmol/L (ref 22–32)
Calcium: 8.6 mg/dL — ABNORMAL LOW (ref 8.9–10.3)
Chloride: 105 mmol/L (ref 98–111)
Creatinine, Ser: 0.82 mg/dL (ref 0.44–1.00)
GFR, Estimated: >60 mL/min (ref >60)
Glucose, Bld: 128 mg/dL — ABNORMAL HIGH (ref 70–99)
Potassium: 3.6 mmol/L (ref 3.5–5.1)
Sodium: 136 mmol/L (ref 135–145)

## 2021-04-24 LAB — RESP PANEL BY RT-PCR (FLU A&B, COVID) ARPGX2
Influenza A by PCR: NEGATIVE
Influenza B by PCR: NEGATIVE
SARS Coronavirus 2 by RT PCR: NEGATIVE

## 2021-04-24 LAB — PREGNANCY, URINE: Preg Test, Ur: NEGATIVE

## 2021-04-24 MED ORDER — ACETAMINOPHEN 160 MG/5ML PO SOLN
650.0000 mg | ORAL | Status: DC | PRN
Start: 2021-04-24 — End: 2021-04-25
  Filled 2021-04-24: qty 20.3

## 2021-04-24 MED ORDER — SENNOSIDES-DOCUSATE SODIUM 8.6-50 MG PO TABS: 1.0000 | ORAL_TABLET | Freq: Every evening | ORAL | Status: DC | PRN

## 2021-04-24 MED ORDER — ACETAMINOPHEN 650 MG RE SUPP: 650.0000 mg | RECTAL | Status: DC | PRN

## 2021-04-24 MED ORDER — ENOXAPARIN SODIUM 40 MG/0.4ML IJ SOSY
40.0000 mg | PREFILLED_SYRINGE | INTRAMUSCULAR | Status: DC
  Administered 2021-04-24: 40 mg via SUBCUTANEOUS
  Filled 2021-04-24: qty 0.4

## 2021-04-24 MED ORDER — STROKE: EARLY STAGES OF RECOVERY BOOK: Freq: Once | Status: DC

## 2021-04-24 MED ORDER — ASPIRIN EC 81 MG PO TBEC
81.0000 mg | DELAYED_RELEASE_TABLET | Freq: Every day | ORAL | Status: DC
  Administered 2021-04-25: 10:00:00 81 mg via ORAL
  Filled 2021-04-24: qty 1

## 2021-04-24 MED ORDER — ACETAMINOPHEN 325 MG PO TABS
650.0000 mg | ORAL_TABLET | ORAL | Status: DC | PRN
Start: 2021-04-24 — End: 2021-04-25

## 2021-04-24 MED ORDER — ASPIRIN 81 MG PO CHEW
324.0000 mg | CHEWABLE_TABLET | Freq: Once | ORAL | Status: AC
  Administered 2021-04-24: 324 mg via ORAL
  Filled 2021-04-24: qty 4

## 2021-04-24 MED ORDER — HYDROXYZINE HCL 10 MG PO TABS
10.0000 mg | ORAL_TABLET | Freq: Three times a day (TID) | ORAL | Status: DC | PRN
  Administered 2021-04-25 (×2): 10 mg via ORAL
  Filled 2021-04-24 (×6): qty 1

## 2021-04-24 NOTE — ED Notes (Signed)
EDP at bedside  

## 2021-04-24 NOTE — Plan of Care (Signed)
Neurology plan of care  Neurology was contacted by Dr. Cinda Quest regarding this 35 yo patient with isolated fluctuating L sided weakness since yesterday. She is outside the window for tPA and has no weakness on exam suggestive of LVO. Apparently she had similar sx before when pregnant but they resolved.  Interim recommendations: - Admit to hospitalist service for stroke w/u; neurology will see tomorrow in consult - Permissive HTN x48 hrs from sx onset or until stroke ruled out by MRI goal BP <220/110. PRN labetalol or hydralazine if BP above these parameters. Avoid oral antihypertensives. - MRI brain with and without contrast - MRA H&N - TTE w/ bubble - Check A1c and LDL + add statin per guidelines - ASA 325mg  now f/b 81mg  daily - q4 hr neuro checks - STAT head CT for any change in neuro exam - Tele - PT/OT/SLP - Stroke education - Amb referral to neurology upon discharge   Neurology will see in formal consultation in AM  Su Monks, MD [facility] [phone]  If 7pm- 7am, please page neurology on call as listed in [place].

## 2021-04-24 NOTE — H&P (Signed)
History and Physical    Darlene Moreno UVO:[id number] DOB: 06-12-1986 DOA: 04/24/2021  PCP: Pcp, No  Patient coming from: Home  I have personally briefly reviewed patient's old medical records in [place]  Chief Complaint: Numbness of left arm and leg  HPI: Darlene Moreno is a 35 y.o. female with no known significant medical history who presented to the ED for evaluation of left-sided numbness.  Patient initially noticed numbness of her left arm from her elbow to her wrist medially yesterday (7/8).  Numbness lasted most of the day before resolving on its own.  This morning she is now experiencing numbness in the posterior aspect of her left leg from just above the knee down to the calf.  She reports continued subjective numbness behind her knee.  She has not had any associated weakness, lightheadedness/dizziness, headache, change in vision, nausea, vomiting, chest pain, dyspnea, abdominal pain, dysuria.  She does report similar numbness in her left arm which occurred after she had a miscarriage 5 months ago.  At the time this was attributed to possible stress reaction.  She reports occasional anxiety which causes some chest tightness but no chest pain.  She denies any tobacco use.  She reports occasional alcohol use on social occasions.  She denies any illicit drug use.  She reports a history of diabetes in her father.  ED Course:  Initial vitals showed BP 154/100, pulse 110, RR 18, temp 98.0 F, SPO2 100% on room air.  Labs show sodium 136, potassium 3.6, bicarb 24, BUN 15, creatinine 0.82, serum glucose 128, WBC 6.4, hemoglobin 13.2, platelets 203,000.  Neurology were consulted and recommended medical admission for stroke work-up.  MRI brain, MRA head and neck, and TTE bubble study ordered and pending.  Neurology will see in formal consultation in the morning.  Patient was given aspirin 324 mg.  The hospitalist service was consulted to admit for further evaluation and  management.  Review of Systems:  All systems reviewed and are negative except as documented in history of present illness above.   Past Medical History:  Diagnosis Date   Wears contact lenses     Past Surgical History:  Procedure Laterality Date   EXCISION MASS NECK N/A 10/07/2020   Procedure: EXCISION  NECK EPIDERMAL INCLUSION CYST;  Surgeon: Carloyn Manner, MD;  Location: [place];  Service: ENT;  Laterality: N/A;  Local   NO PAST SURGERIES      Social History:  reports that she has never smoked. She has never used smokeless tobacco. She reports current alcohol use. She reports that she does not use drugs.  Allergies  Allergen Reactions   Amoxicillin Rash    Family History  Problem Relation Age of Onset   Hypertension Mother    Diabetes Father    Breast cancer Neg Hx    Ovarian cancer Neg Hx    Colon cancer Neg Hx      Prior to Admission medications   Medication Sig Start Date End Date Taking? Authorizing Provider  Prenatal Vit-Fe Fumarate-FA (MULTIVITAMIN-PRENATAL) 27-0.8 MG TABS tablet Take 1 tablet by mouth daily at 12 noon.    [provider]    Physical Exam: Vitals:   04/24/21 1637  BP: (!) 154/100  Pulse: (!) 110  Resp: 18  Temp: 98 F (36.7 C)  TempSrc: Oral  SpO2: 100%  Weight: 77.1 kg  Height: 5\' 1"  (1.549 m)   Constitutional: Sitting up in bed, NAD, calm, comfortable Eyes: PERRL, lids and  conjunctivae normal ENMT: Mucous membranes are moist. Posterior pharynx clear of any exudate or lesions.Normal dentition.  Neck: normal, supple, no masses. Respiratory: clear to auscultation bilaterally, no wheezing, no crackles. Normal respiratory effort. No accessory muscle use.  Cardiovascular: Tachycardic with regular rhythm, no murmurs / rubs / gallops. No extremity edema. 2+ pedal pulses. Abdomen: no tenderness, no masses palpated. No hepatosplenomegaly. Bowel sounds positive.  Musculoskeletal: no clubbing / cyanosis. No joint  deformity upper and lower extremities. Good ROM, no contractures. Normal muscle tone.  Skin: no rashes, lesions, ulcers. No induration Neurologic: CN 2-12 grossly intact. Sensation intact. Strength 5/5 in all 4.  +2 patellar reflex bilaterally. Psychiatric: Normal judgment and insight. Alert and oriented x 3. Normal mood.   Labs on Admission: I have personally reviewed following labs and imaging studies  CBC: Recent Labs  Lab 04/24/21 1642  WBC 6.4  HGB 13.2  HCT 42.0  MCV 77.3*  PLT 706   Basic Metabolic Panel: Recent Labs  Lab 04/24/21 1642  NA 136  K 3.6  CL 105  CO2 24  GLUCOSE 128*  BUN 15  CREATININE 0.82  CALCIUM 8.6*   GFR: Estimated Creatinine Clearance: 91.7 mL/min (by C-G formula based on SCr of 0.82 mg/dL). Liver Function Tests: No results for input(s): AST, ALT, ALKPHOS, BILITOT, PROT, ALBUMIN in the last 168 hours. No results for input(s): LIPASE, AMYLASE in the last 168 hours. No results for input(s): AMMONIA in the last 168 hours. Coagulation Profile: No results for input(s): INR, PROTIME in the last 168 hours. Cardiac Enzymes: No results for input(s): CKTOTAL, CKMB, CKMBINDEX, TROPONINI in the last 168 hours. BNP (last 3 results) No results for input(s): PROBNP in the last 8760 hours. HbA1C: No results for input(s): HGBA1C in the last 72 hours. CBG: No results for input(s): GLUCAP in the last 168 hours. Lipid Profile: No results for input(s): CHOL, HDL, LDLCALC, TRIG, CHOLHDL, LDLDIRECT in the last 72 hours. Thyroid Function Tests: No results for input(s): TSH, T4TOTAL, FREET4, T3FREE, THYROIDAB in the last 72 hours. Anemia Panel: No results for input(s): VITAMINB12, FOLATE, FERRITIN, TIBC, IRON, RETICCTPCT in the last 72 hours. Urine analysis:    Component Value Date/Time   COLORURINE YELLOW 10/01/2020 1129   APPEARANCEUR Clear 11/20/2020 0927   LABSPEC 1.030 10/01/2020 1129   PHURINE 6.5 10/01/2020 1129   GLUCOSEU Negative 12/03/2020 1117    GLUCOSEU Negative 11/20/2020 0927   HGBUR SMALL (A) 10/01/2020 1129   BILIRUBINUR Negative 12/03/2020 1117   BILIRUBINUR Negative 11/20/2020 0927   KETONESUR 5 (A) 10/01/2020 1129   PROTEINUR Trace 11/20/2020 0927   PROTEINUR 30 (A) 10/01/2020 1129   UROBILINOGEN 0.2 12/03/2020 1117   NITRITE Negative 12/03/2020 1117   NITRITE Negative 11/20/2020 0927   NITRITE POSITIVE (A) 10/01/2020 1129   LEUKOCYTESUR Negative 12/03/2020 1117   LEUKOCYTESUR Trace (A) 11/20/2020 0927   LEUKOCYTESUR TRACE (A) 10/01/2020 1129    Radiological Exams on Admission: No results found.  EKG: Ordered and pending.  Assessment/Plan Principal Problem:   Numbness and tingling of left arm and leg   Darlene Moreno is a 35 y.o. female with no known significant medical history who is admitted for evaluation of left-sided numbness.  Numbness of left arm and leg: Fluctuating left upper and lower extremity subjective numbness.  Neurology requested admission for further work-up and will formally consult in AM. -Follow MRI brain, MRA head and neck -Echocardiogram -Given aspirin 324 mg to be followed by 1 mg daily -A1c, lipid panel -  PT/OT -Monitor on telemetry, continue neurochecks  Anxiety: Patient reports feeling anxious.  Tachycardia likely related to anxiety.  Will start Atarax as needed.  DVT prophylaxis: Lovenox Code Status: Full code Family Communication: Discussed with patient, she has discussed with family Disposition Plan: From home and likely discharge to home pending neurology evaluation Consults called: Neurology Level of care: Med-Surg Admission status:  Status is: Observation  The patient remains OBS appropriate and will d/c before 2 midnights.  Dispo: The patient is from: Home              Anticipated d/c is to: Home              Patient currently is not medically stable to d/c.   Difficult to place patient No  Zada Finders MD [facility]  If 7PM-7AM, please contact  night-coverage [email or web address]  04/24/2021, 7:34 PM

## 2021-04-24 NOTE — ED Notes (Signed)
Teleneurology cart at bedside

## 2021-04-24 NOTE — ED Notes (Signed)
Snack provided

## 2021-04-24 NOTE — ED Triage Notes (Signed)
Pt c/o of L arm numbness that began yesterday. States that's better today but L leg numbness today. States this happened back in march when she lost her child. States was told it's most likely d/t stress/anxiety. A&O, speech clear. Ambulatory without difficulty. Denies blurred vision.

## 2021-04-24 NOTE — ED Provider Notes (Signed)
[facility] Emergency Department Provider Note   ____________________________________________   Event Date/Time   First MD Initiated Contact with Patient 04/24/21 1820     (approximate)  I have reviewed the triage vital signs and the nursing notes.   HISTORY  Chief Complaint Numbness    HPI Darlene Moreno is a 35 y.o. female who reports she had some numbness and tingling of the left arm from the elbow down to the wrist on the ulnar surface yesterday.  It lasted most the day and was gone this morning.  This morning however she had numbness and tingling of the left leg from just above the knee down into the calf.  There is no pain associated with either 1 of these there was no numbness above or below the numb area or in any other part of the extremity.  She had something like this before when she miscarried and OB/GYN doctor thought it might be a stress reaction.  She has no other medical problems although her family does have a family history of diabetes.  There is no weakness or incoordination.  The left leg is still numb.        Past Medical History:  Diagnosis Date   Wears contact lenses     Patient Active Problem List   Diagnosis Date Noted   ASCUS of cervix with negative high risk HPV 12/14/2020   History of spontaneous abortion 12/14/2020   Type O blood, Rh positive 11/21/2020   Susceptible to varicella (non-immune), currently pregnant 11/21/2020   E. coli UTI 11/09/2020    Past Surgical History:  Procedure Laterality Date   EXCISION MASS NECK N/A 10/07/2020   Procedure: EXCISION  NECK EPIDERMAL INCLUSION CYST;  Surgeon: Carloyn Manner, MD;  Location: [place];  Service: ENT;  Laterality: N/A;  Local   NO PAST SURGERIES      Prior to Admission medications   Medication Sig Start Date End Date Taking? Authorizing Provider  Prenatal Vit-Fe Fumarate-FA (MULTIVITAMIN-PRENATAL) 27-0.8 MG TABS tablet Take 1 tablet by mouth daily  at 12 noon.    [provider]    Allergies Amoxicillin  Family History  Problem Relation Age of Onset   Hypertension Mother    Diabetes Father    Breast cancer Neg Hx    Ovarian cancer Neg Hx    Colon cancer Neg Hx     Social History Social History   Tobacco Use   Smoking status: Never   Smokeless tobacco: Never  Vaping Use   Vaping Use: Never used  Substance Use Topics   Alcohol use: Yes   Drug use: Never    Review of Systems  Constitutional: No fever/chills Eyes: No visual changes. ENT: No sore throat. Cardiovascular: Denies chest pain. Respiratory: Denies shortness of breath. Gastrointestinal: No abdominal pain.  No nausea, no vomiting.  No diarrhea.  No constipation. Genitourinary: Negative for dysuria. Musculoskeletal: Negative for back pain. Skin: Negative for rash. Neurological: Negative for headaches, focal weakness   ____________________________________________   PHYSICAL EXAM:  VITAL SIGNS: ED Triage Vitals [04/24/21 1637]  Enc Vitals Group     BP (!) 154/100     Pulse Rate (!) 110     Resp 18     Temp 98 F (36.7 C)     Temp Source Oral     SpO2 100 %     Weight 170 lb (77.1 kg)     Height 5\' 1"  (1.549 m)  Head Circumference      Peak Flow      Pain Score 0     Pain Loc      Pain Edu?      Excl. in [place]?    Constitutional: Alert and oriented. Well appearing and in no acute distress. Eyes: Conjunctivae are normal. PERRL. EOMI. Head: Atraumatic. Nose: No congestion/rhinnorhea. Mouth/Throat: Mucous membranes are moist.  Oropharynx non-erythematous. Neck: No stridor.   Cardiovascular: Normal rate, regular rhythm. Grossly normal heart sounds.  Good peripheral circulation. Respiratory: Normal respiratory effort.  No retractions. Lungs CTAB. Gastrointestinal: Soft and nontender. No distention. No abdominal bruits. No CVA tenderness. Musculoskeletal: No lower extremity tenderness nor edema.   Neurologic:  Normal speech and  language.  Cranial nerves II through XII appear to be intact other no visual fields were checked.  Cerebellar finger-nose and heel-to-shin are normal.  Motor strength is 5/5 throughout patient does have a numbness in the area described in the HPI Skin:  Skin is warm, dry and intact. No rash noted.   ____________________________________________   LABS (all labs ordered are listed, but only abnormal results are displayed)  Labs Reviewed  CBC - Abnormal; Notable for the following components:      Result Value   RBC 5.43 (*)    MCV 77.3 (*)    MCH 24.3 (*)    RDW 16.0 (*)    All other components within normal limits  BASIC METABOLIC PANEL - Abnormal; Notable for the following components:   Glucose, Bld 128 (*)    Calcium 8.6 (*)    All other components within normal limits  RESP PANEL BY RT-PCR (FLU A&B, COVID) ARPGX2  PREGNANCY, URINE   ____________________________________________  EKG   ____________________________________________  RADIOLOGY Gertha Calkin, personally viewed and evaluated these images (plain radiographs) as part of my medical decision making, as well as reviewing the written report by the radiologist.  ED MD interpretation:   Official radiology report(s): No results found.  ____________________________________________   PROCEDURES  Procedure(s) performed (including Critical Care):  Procedures   ____________________________________________   INITIAL IMPRESSION / ASSESSMENT AND PLAN / ED COURSE  Patient discussed in detail with Su Monks, MD the neurologist on-call.  I am more worried about multiple sclerosis or some other neurological problem that I am about stroke although stroke still is a possibility.  Dr. Quinn Axe feels it would be important to admit the patient and start her on aspirin and do an MRI of the brain.  We will have neurology see the patient in person.  She will put an eye note with complete recommendations.  I agree with her  recommendations that she discussed with me.            ____________________________________________   FINAL CLINICAL IMPRESSION(S) / ED DIAGNOSES  Final diagnoses:  Numbness     ED Discharge Orders     None        Note:  This document was prepared using Dragon voice recognition software and may include unintentional dictation errors.    Nena Polio, MD 04/24/21 [phone]

## 2021-04-25 ENCOUNTER — Observation Stay: Payer: BC Managed Care – PPO

## 2021-04-25 DIAGNOSIS — R202 Paresthesia of skin: Secondary | ICD-10-CM | POA: Diagnosis not present

## 2021-04-25 DIAGNOSIS — G35 Multiple sclerosis: Secondary | ICD-10-CM | POA: Diagnosis not present

## 2021-04-25 DIAGNOSIS — R200 Anesthesia of skin: Secondary | ICD-10-CM

## 2021-04-25 LAB — BASIC METABOLIC PANEL
Anion gap: 6 (ref 5–15)
Anion gap: 8 (ref 5–15)
BUN: 12 mg/dL (ref 6–20)
BUN: 11 mg/dL (ref 6–20)
CO2: 25 mmol/L (ref 22–32)
CO2: 22 mmol/L (ref 22–32)
Calcium: 8.5 mg/dL — ABNORMAL LOW (ref 8.9–10.3)
Calcium: 8.5 mg/dL — ABNORMAL LOW (ref 8.9–10.3)
Chloride: 107 mmol/L (ref 98–111)
Chloride: 107 mmol/L (ref 98–111)
Creatinine, Ser: 0.66 mg/dL (ref 0.44–1.00)
Creatinine, Ser: 0.71 mg/dL (ref 0.44–1.00)
GFR, Estimated: >60 mL/min (ref >60)
GFR, Estimated: >60 mL/min (ref >60)
Glucose, Bld: 99 mg/dL (ref 70–99)
Glucose, Bld: 102 mg/dL — ABNORMAL HIGH (ref 70–99)
Potassium: 3.7 mmol/L (ref 3.5–5.1)
Potassium: 3.5 mmol/L (ref 3.5–5.1)
Sodium: 138 mmol/L (ref 135–145)
Sodium: 137 mmol/L (ref 135–145)

## 2021-04-25 LAB — CBC
HCT: 39.8 % (ref 36.0–46.0)
HCT: 39 % (ref 36.0–46.0)
Hemoglobin: 12.7 g/dL (ref 12.0–15.0)
Hemoglobin: 12.7 g/dL (ref 12.0–15.0)
MCH: 24.4 pg — ABNORMAL LOW (ref 26.0–34.0)
MCH: 24.5 pg — ABNORMAL LOW (ref 26.0–34.0)
MCHC: 31.9 g/dL (ref 30.0–36.0)
MCHC: 32.6 g/dL (ref 30.0–36.0)
MCV: 76.5 fL — ABNORMAL LOW (ref 80.0–100.0)
MCV: 75.3 fL — ABNORMAL LOW (ref 80.0–100.0)
Platelets: 279 10*3/uL (ref 150–400)
Platelets: 286 10*3/uL (ref 150–400)
RBC: 5.2 MIL/uL — ABNORMAL HIGH (ref 3.87–5.11)
RBC: 5.18 MIL/uL — ABNORMAL HIGH (ref 3.87–5.11)
RDW: 16.2 % — ABNORMAL HIGH (ref 11.5–15.5)
RDW: 16 % — ABNORMAL HIGH (ref 11.5–15.5)
WBC: 6.8 10*3/uL (ref 4.0–10.5)
WBC: 6.7 10*3/uL (ref 4.0–10.5)
nRBC: 0 % (ref 0.0–0.2)
nRBC: 0 % (ref 0.0–0.2)

## 2021-04-25 LAB — FOLATE
Folate: 34 ng/mL (ref >5.9)
Folate: 36 ng/mL (ref >5.9)

## 2021-04-25 LAB — LIPID PANEL
Cholesterol: 165 mg/dL (ref 0–200)
HDL: 52 mg/dL (ref >40)
LDL Cholesterol: 95 mg/dL (ref 0–99)
Total CHOL/HDL Ratio: 3.2 RATIO
Triglycerides: 88 mg/dL (ref <150)
VLDL: 18 mg/dL (ref 0–40)

## 2021-04-25 LAB — IRON AND TIBC
Iron: 40 ug/dL (ref 28–170)
Iron: 53 ug/dL (ref 28–170)
Saturation Ratios: 10 % — ABNORMAL LOW (ref 10.4–31.8)
Saturation Ratios: 13 % (ref 10.4–31.8)
TIBC: 423 ug/dL (ref 250–450)
TIBC: 426 ug/dL (ref 250–450)
UIBC: 383 ug/dL
UIBC: 373 ug/dL

## 2021-04-25 LAB — VITAMIN B12
Vitamin B-12: 318 pg/mL (ref 180–914)
Vitamin B-12: 360 pg/mL (ref 180–914)

## 2021-04-25 LAB — MAGNESIUM: Magnesium: 2.1 mg/dL (ref 1.7–2.4)

## 2021-04-25 LAB — HEMOGLOBIN A1C
Hgb A1c MFr Bld: 5.3 % (ref 4.8–5.6)
Mean Plasma Glucose: 105.41 mg/dL

## 2021-04-25 LAB — VITAMIN D 25 HYDROXY (VIT D DEFICIENCY, FRACTURES): Vit D, 25-Hydroxy: 21.17 ng/mL — ABNORMAL LOW (ref 30–100)

## 2021-04-25 LAB — PHOSPHORUS: Phosphorus: 2.4 mg/dL — ABNORMAL LOW (ref 2.5–4.6)

## 2021-04-25 LAB — TSH: TSH: 3.234 u[IU]/mL (ref 0.350–4.500)

## 2021-04-25 LAB — FERRITIN: Ferritin: 6 ng/mL — ABNORMAL LOW (ref 11–307)

## 2021-04-25 LAB — LDL CHOLESTEROL, DIRECT: Direct LDL: 100.5 mg/dL — ABNORMAL HIGH (ref 0–99)

## 2021-04-25 IMAGING — MR MR MRA HEAD W/O CM
1 series · 19 of 48 positions shown · IV contrast (gadavist)
Comparison: None.

CLINICAL DATA: Multiple sclerosis with new event. Left-sided
numbness

EXAM:
MRI HEAD WITHOUT AND WITH CONTRAST
MRA HEAD WITHOUT CONTRAST
MRA NECK WITHOUT AND WITH CONTRAST
TECHNIQUE: Multiplanar, multiecho pulse sequences of the brain and surrounding
structures were obtained without and with intravenous contrast.
Angiographic images of the Circle of Willis were obtained using MRA
technique without intravenous contrast. Angiographic images of the
neck were obtained using MRA technique without and with intravenous
contrast. Carotid stenosis measurements (when applicable) are
obtained utilizing NASCET criteria, using the distal internal
carotid diameter as the denominator.
CONTRAST:  7mL GADAVIST GADOBUTROL 1 MMOL/ML IV SOLN

[Series 10: TOF · axial · 0.5mm · 0.41mm/px · z∈[-120,-25]mm · 19 of 205 slices shown]
[im 1/205]
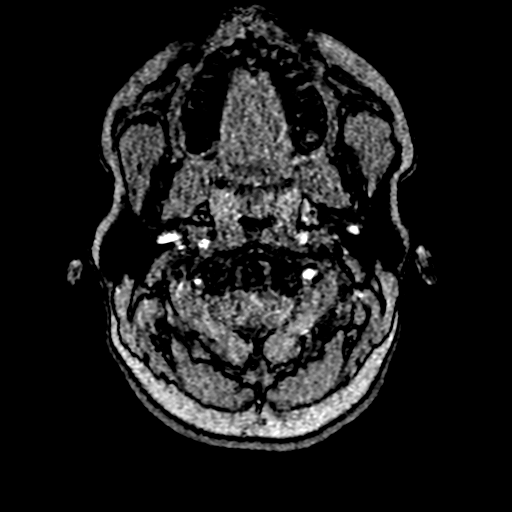
[im 5/205]
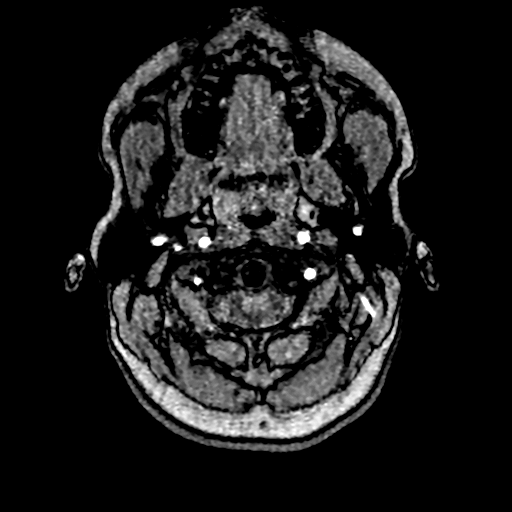
[im 9/205]
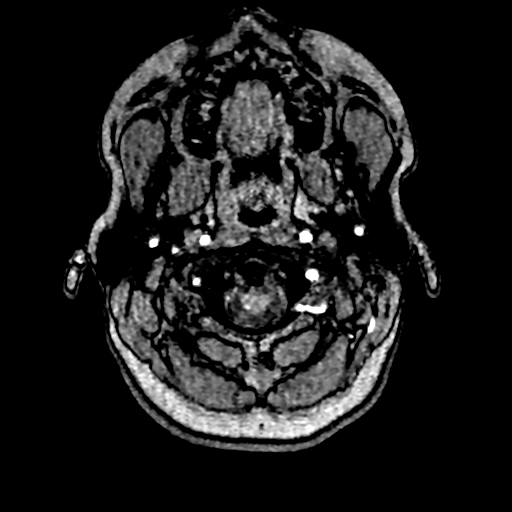
[im 14/205]
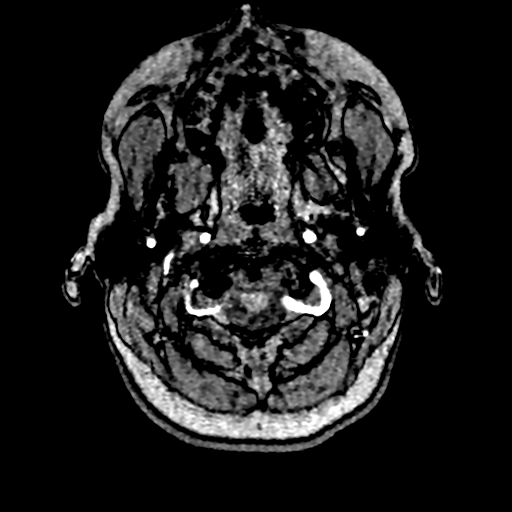
[im 18/205]
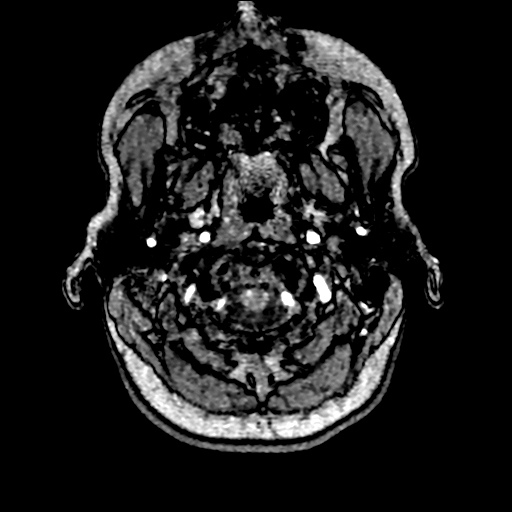
[im 22/205]
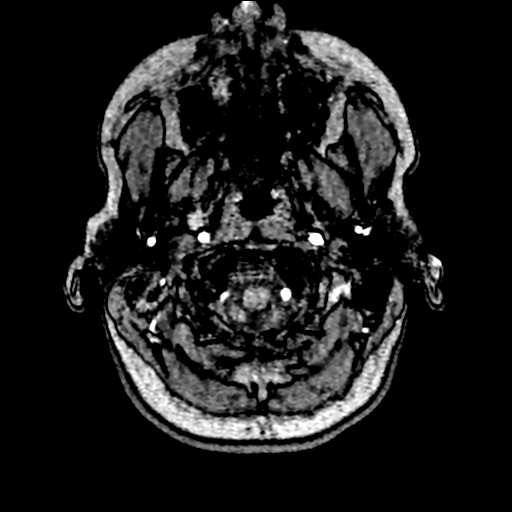
[im 27/205]
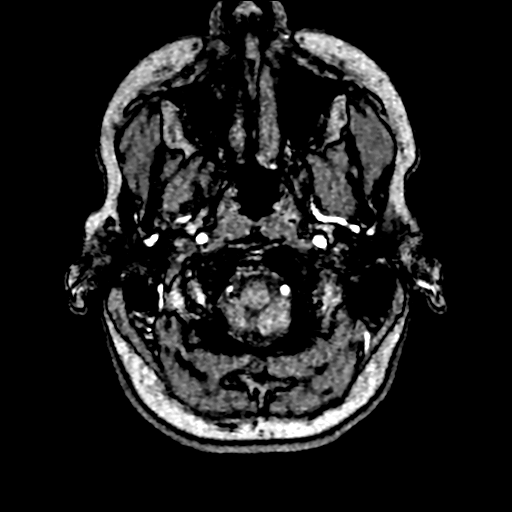
[im 31/205]
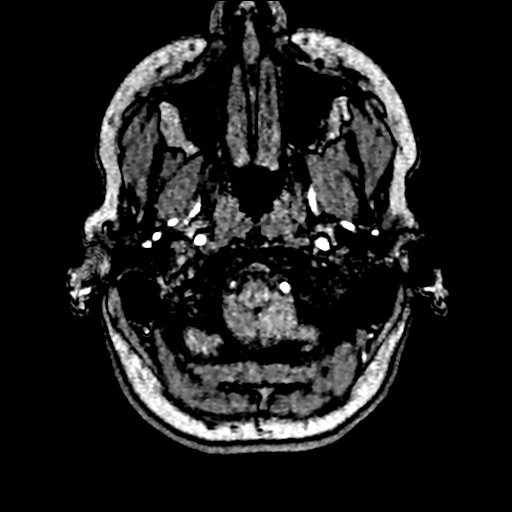
[im 35/205]
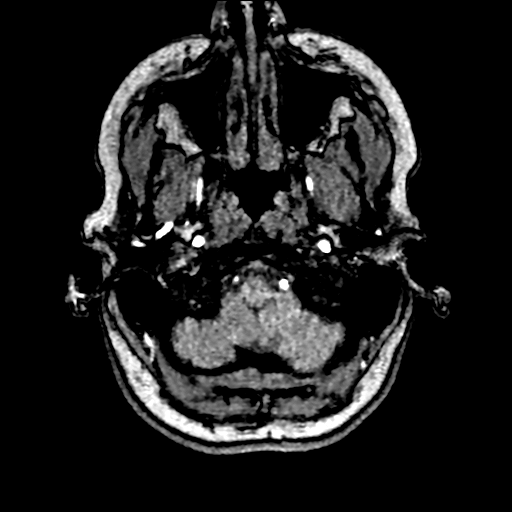
[im 40/205]
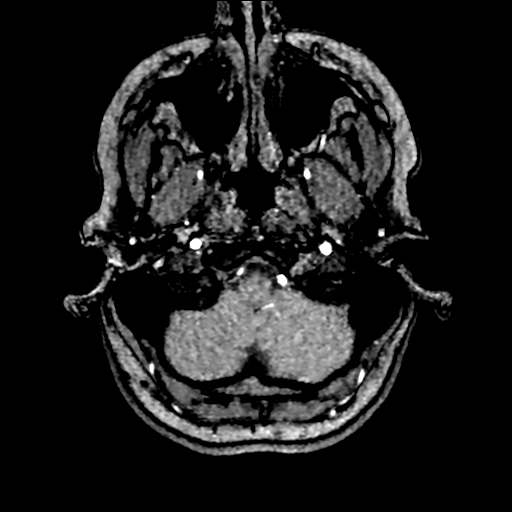
[im 44/205]
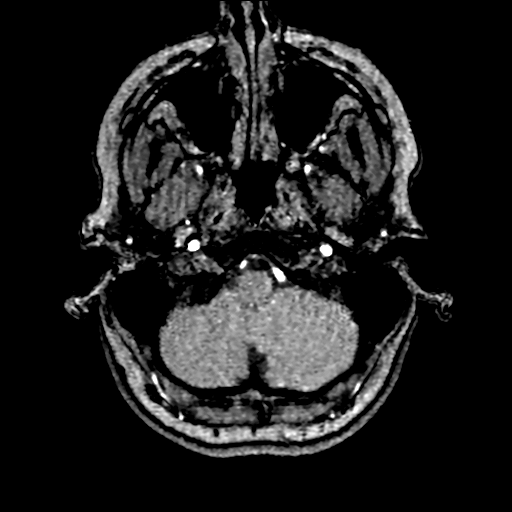
[im 66/205]
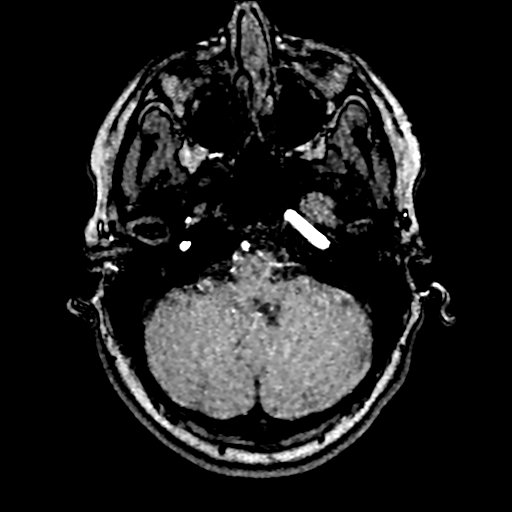
[im 92/205]
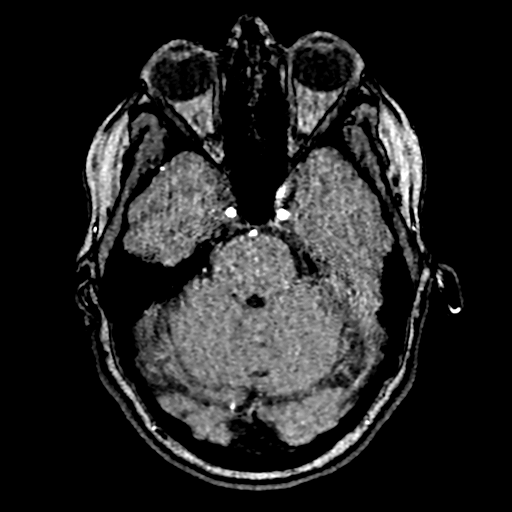
[im 105/205]
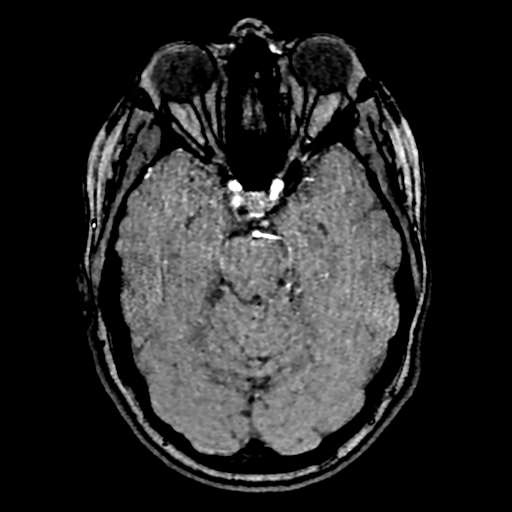
[im 118/205]
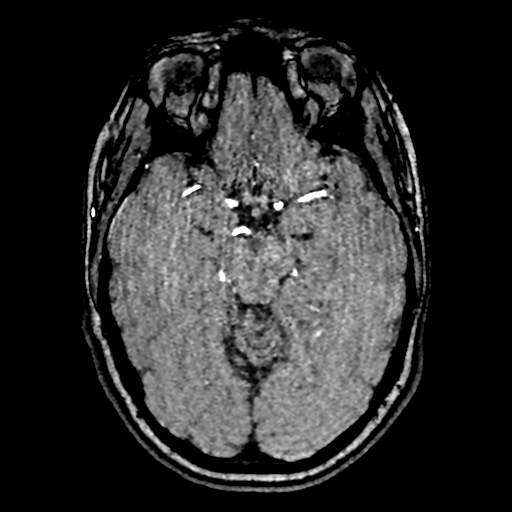
[im 144/205]
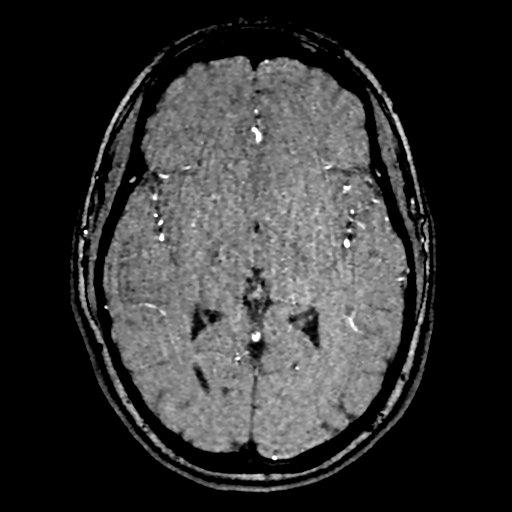
[im 170/205]
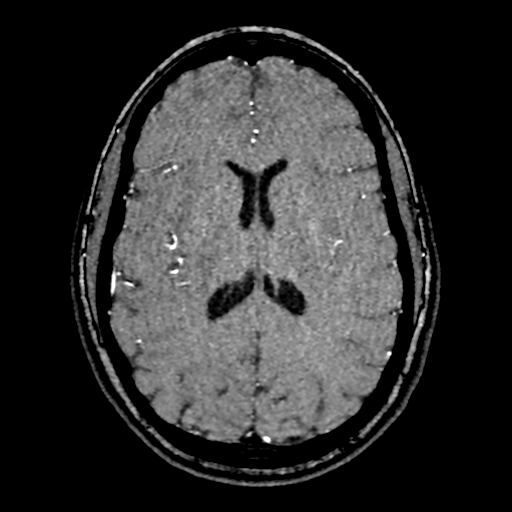
[im 174/205]
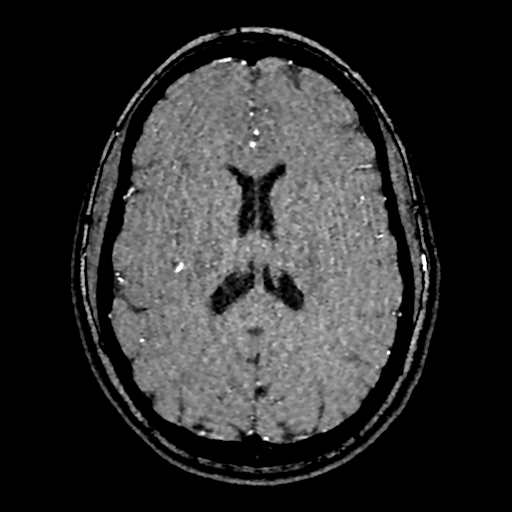
[im 196/205]
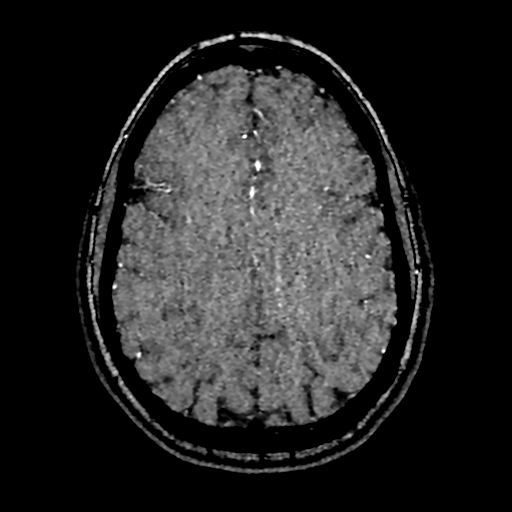

[19 of 48 positions shown; findings below may reference images not displayed]

FINDINGS: MRI HEAD FINDINGS

Brain: Single tiny FLAIR hyperintensity in the frontal white matter,
allowable for age. No generalized findings of white matter disease.
In the deep right hemispheric white matter is a saturating cyst
without adjacent gliosis, consistent with dilated perivascular space
or incidental neural glial cyst. No infarct, hydrocephalus, mass, or
collection. No abnormal enhancement

Vascular: See below

Skull and upper cervical spine: Borderline low marrow signal in the
upper cervical spine the correlates with anemia by labs.

Sinuses/Orbits: Negative

MRA HEAD FINDINGS

Vessels are smooth and widely patent. No branch occlusion, beading,
or aneurysm. Intact circle-of-Willis.

MRA NECK FINDINGS

Antegrade flow in the carotid and vertebral arteries by
time-of-flight imaging.

No incidental finding in the cervical soft tissues on the neck mask.

Postcontrast imaging shows a normal arch and proximal great vessels.
The left vertebral artery is dominant. Right vertebral artery is
largely obscured by venous plexus enhancement on postcontrast
imaging, but is normal appearing where covered by time-of-flight.
Bilateral carotids are smoothly contoured and widely patent.
IMPRESSION: Brain MRI:

No explanation for symptoms. No infarct or generalized white matter
disease.

MRA of the head neck:

Negative.

## 2021-04-25 IMAGING — MR MR HEAD WO/W CM
14 series · 46 of 48 positions shown · IV contrast (7ml Gadavist)
Comparison: None.

CLINICAL DATA: Multiple sclerosis with new event. Left-sided
numbness

EXAM:
MRI HEAD WITHOUT AND WITH CONTRAST
MRA HEAD WITHOUT CONTRAST
MRA NECK WITHOUT AND WITH CONTRAST
TECHNIQUE: Multiplanar, multiecho pulse sequences of the brain and surrounding
structures were obtained without and with intravenous contrast.
Angiographic images of the Circle of Willis were obtained using MRA
technique without intravenous contrast. Angiographic images of the
neck were obtained using MRA technique without and with intravenous
contrast. Carotid stenosis measurements (when applicable) are
obtained utilizing NASCET criteria, using the distal internal
carotid diameter as the denominator.
CONTRAST:  7mL GADAVIST GADOBUTROL 1 MMOL/ML IV SOLN

[Series 5: ax dwi_tracew · axial · 3.0mm · 0.65mm/px · z∈[-121,+31]mm · 3 of 48 slices shown]
[im 1/48]
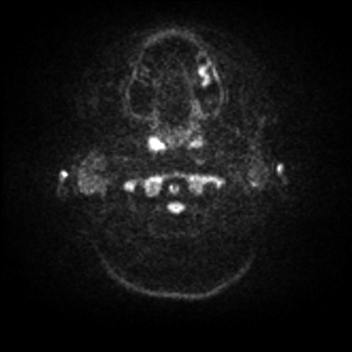
[im 24/48]
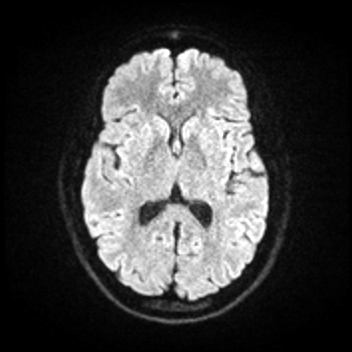
[im 48/48]
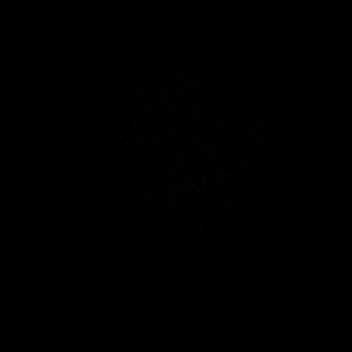

[Series 6: ax dwi_adc · axial · 3.0mm · 0.65mm/px · z∈[-121,+24]mm · 3 of 46 slices shown]
[im 1/46]
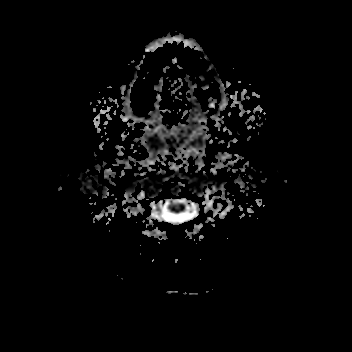
[im 23/46]
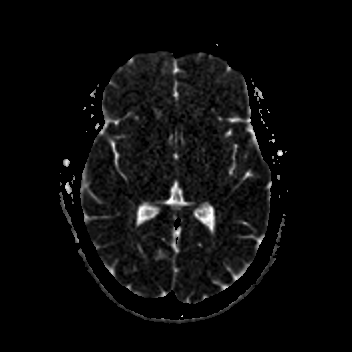
[im 46/46]
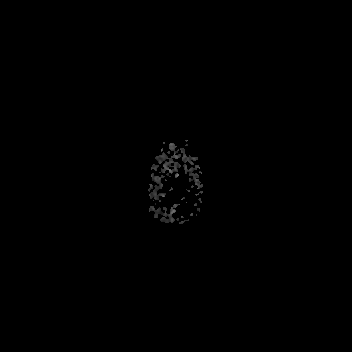

[Series 7: cor dwi_tracew · coronal · 5.0mm · 0.60mm/px · 3 of 38 slices shown]
[im 1/38]
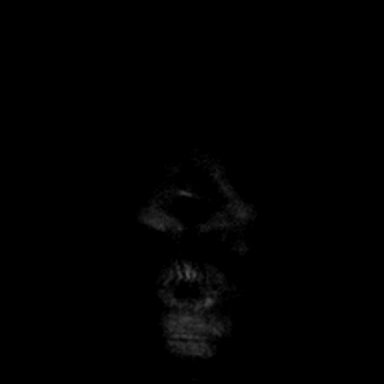
[im 19/38]
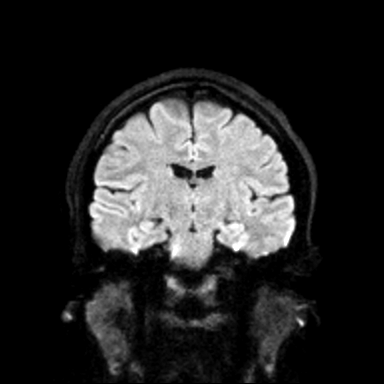
[im 38/38]
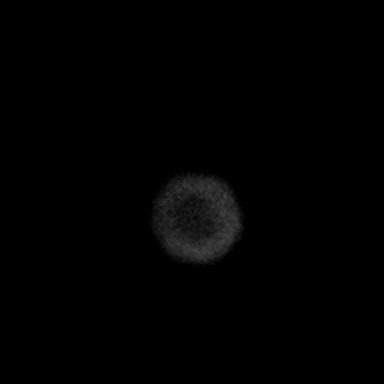

[Series 8: cor dwi_adc · coronal · 5.0mm · 0.60mm/px · 3 of 38 slices shown]
[im 1/38]
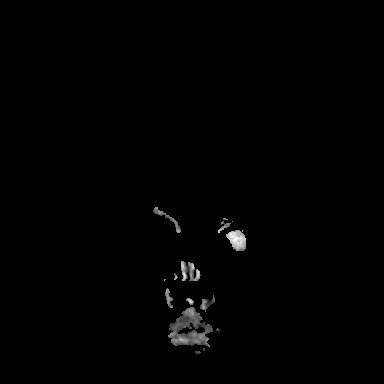
[im 19/38]
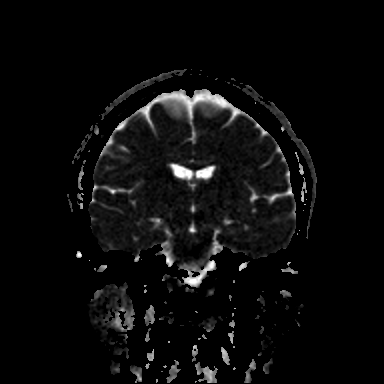
[im 38/38]
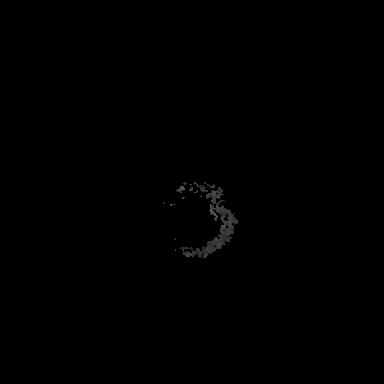

[Series 9: T1 · sagittal · 5.0mm · 0.62mm/px · 2 of 25 slices shown]
[im 1/25]
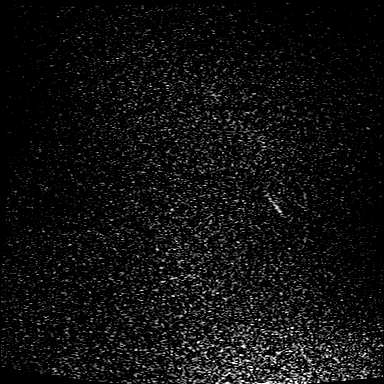
[im 25/25]
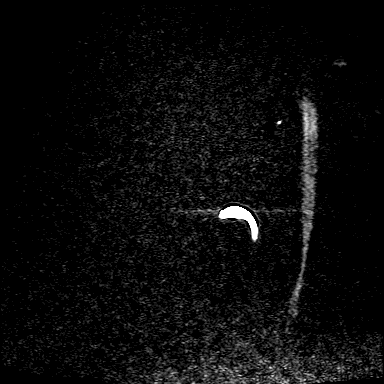

[Series 15: FLAIR · sagittal · 5.0mm · 0.94mm/px · 2 of 25 slices shown]
[im 1/25]
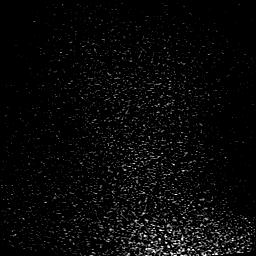
[im 25/25]
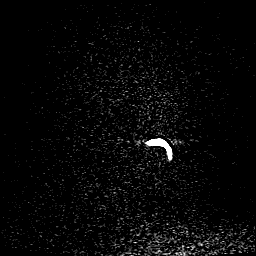

[Series 16: T2 · axial · 5.0mm · 0.53mm/px · z∈[-120,+32]mm · 2 of 27 slices shown]
[im 1/27]
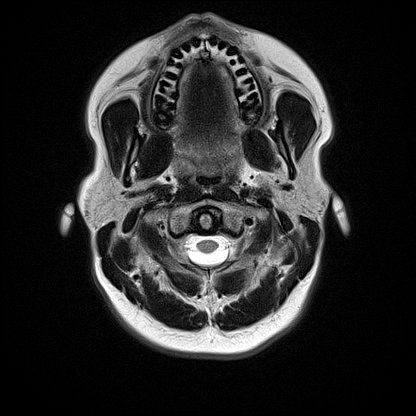
[im 27/27]
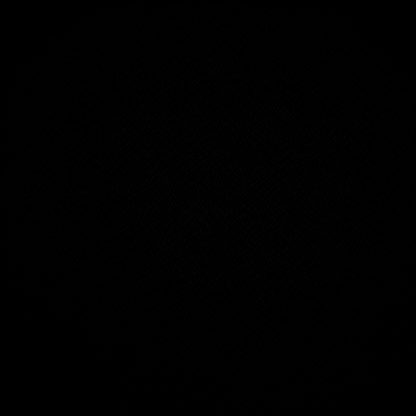

[Series 17: mag_images · axial · 3.0mm · 0.90mm/px · z∈[-132,+42]mm · 4 of 60 slices shown]
[im 1/60]
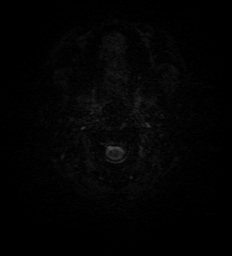
[im 20/60]
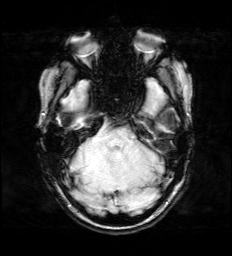
[im 40/60]
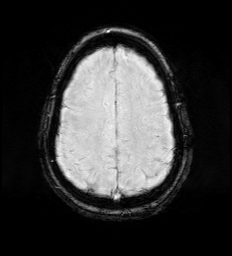
[im 60/60]
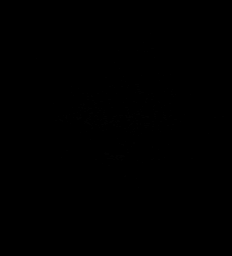

[Series 18: pha_images · axial · 3.0mm · 0.90mm/px · z∈[-132,+42]mm · 4 of 56 slices shown]
[im 1/56]
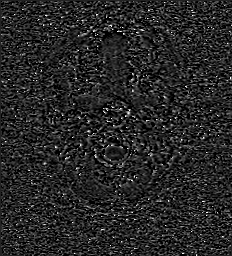
[im 19/56]
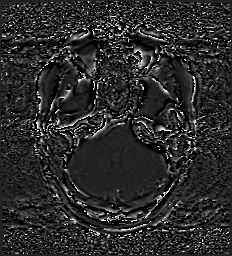
[im 37/56]
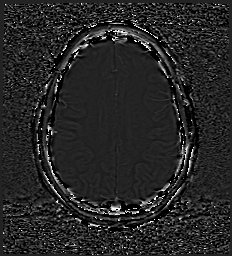
[im 56/56]
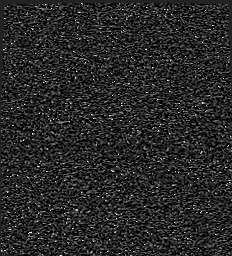

[Series 19: swi_images · axial · 3.0mm · 0.90mm/px · z∈[-132,+42]mm · 4 of 60 slices shown]
[im 1/60]
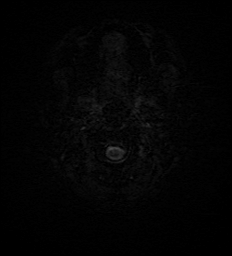
[im 20/60]
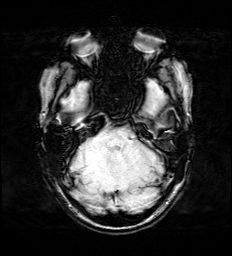
[im 40/60]
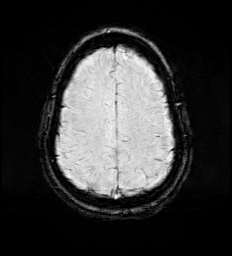
[im 60/60]
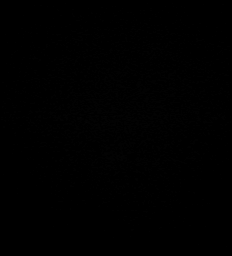

[Series 38: T2 post-contrast · coronal · 5.0mm · 0.57mm/px · 2 of 29 slices shown]
[im 1/29]
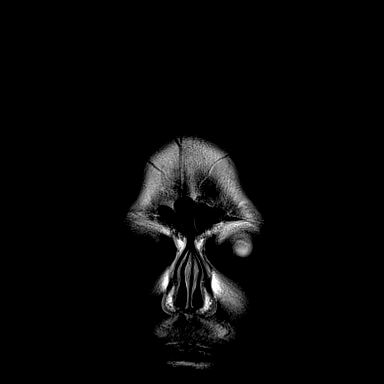
[im 29/29]
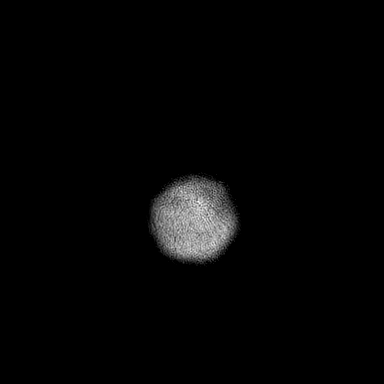

[Series 39: T1 post-contrast · axial · 1.0mm · 0.98mm/px · z∈[-133,+37]mm · 10 of 175 slices shown (1 of 3)]
[im 1/175]
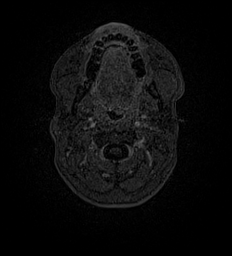
[im 16/175]
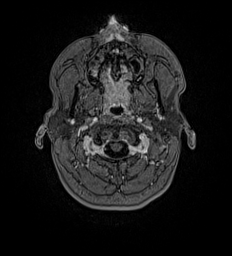
[im 32/175]
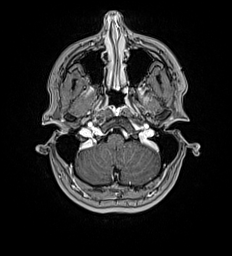
[im 48/175]
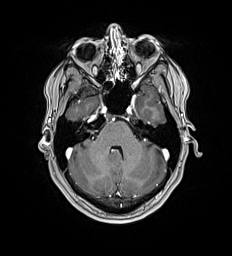
[im 80/175]
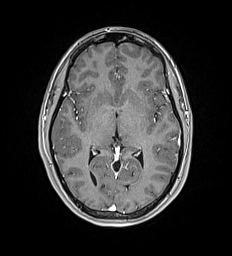
[im 95/175]
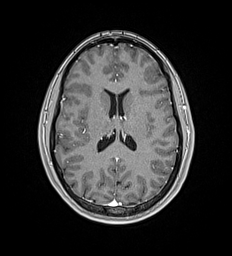
[im 127/175]
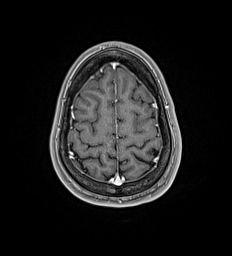
[im 143/175]
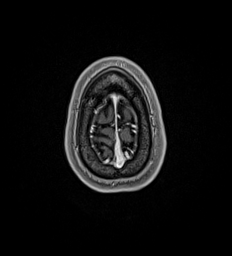
[im 159/175]
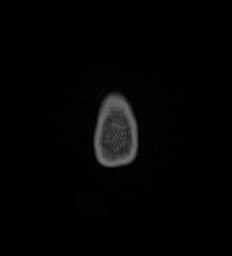
[im 175/175]
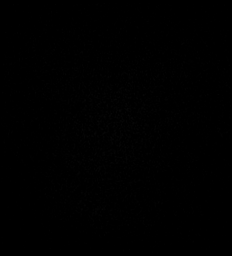

[Series 40: T1 post-contrast · coronal · 5.0mm · 0.57mm/px · 2 of 29 slices shown (2 of 3)]
[im 1/29]
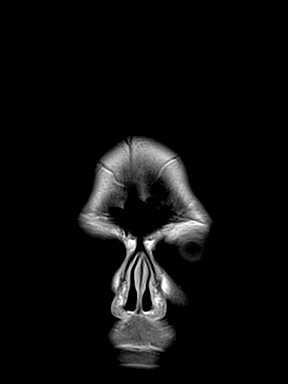
[im 29/29]
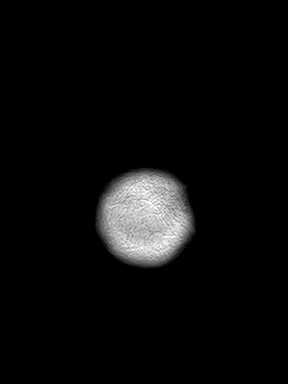

[Series 41: T1 post-contrast · sagittal · 5.0mm · 0.62mm/px · 2 of 25 slices shown (3 of 3)]
[im 1/25]
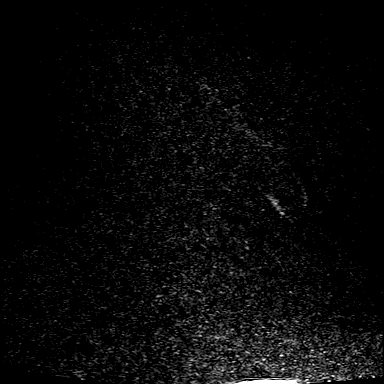
[im 25/25]
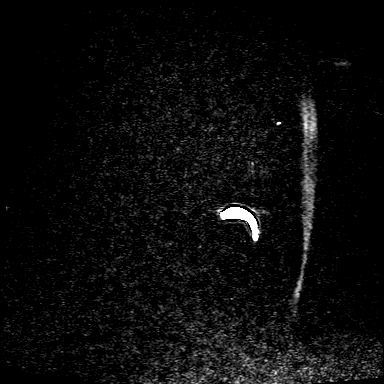

[46 of 48 positions shown; findings below may reference images not displayed]

FINDINGS: MRI HEAD FINDINGS

Brain: Single tiny FLAIR hyperintensity in the frontal white matter,
allowable for age. No generalized findings of white matter disease.
In the deep right hemispheric white matter is a saturating cyst
without adjacent gliosis, consistent with dilated perivascular space
or incidental neural glial cyst. No infarct, hydrocephalus, mass, or
collection. No abnormal enhancement

Vascular: See below

Skull and upper cervical spine: Borderline low marrow signal in the
upper cervical spine the correlates with anemia by labs.

Sinuses/Orbits: Negative

MRA HEAD FINDINGS

Vessels are smooth and widely patent. No branch occlusion, beading,
or aneurysm. Intact circle-of-Willis.

MRA NECK FINDINGS

Antegrade flow in the carotid and vertebral arteries by
time-of-flight imaging.

No incidental finding in the cervical soft tissues on the neck mask.

Postcontrast imaging shows a normal arch and proximal great vessels.
The left vertebral artery is dominant. Right vertebral artery is
largely obscured by venous plexus enhancement on postcontrast
imaging, but is normal appearing where covered by time-of-flight.
Bilateral carotids are smoothly contoured and widely patent.
IMPRESSION: Brain MRI:

No explanation for symptoms. No infarct or generalized white matter
disease.

MRA of the head neck:

Negative.

## 2021-04-25 IMAGING — MR MR MRA NECK WO/W CM
5 of 6 series · 34 of 48 positions shown · IV contrast (gadavist)
Comparison: None.

CLINICAL DATA: Multiple sclerosis with new event. Left-sided
numbness

EXAM:
MRI HEAD WITHOUT AND WITH CONTRAST
MRA HEAD WITHOUT CONTRAST
MRA NECK WITHOUT AND WITH CONTRAST
TECHNIQUE: Multiplanar, multiecho pulse sequences of the brain and surrounding
structures were obtained without and with intravenous contrast.
Angiographic images of the Circle of Willis were obtained using MRA
technique without intravenous contrast. Angiographic images of the
neck were obtained using MRA technique without and with intravenous
contrast. Carotid stenosis measurements (when applicable) are
obtained utilizing NASCET criteria, using the distal internal
carotid diameter as the denominator.
CONTRAST:  7mL GADAVIST GADOBUTROL 1 MMOL/ML IV SOLN

[Series 32: angio_fl3d_cor_pre_ttc=2.0s · coronal · 0.9mm · 0.85mm/px · 10 of 96 slices shown]
[im 1/96]
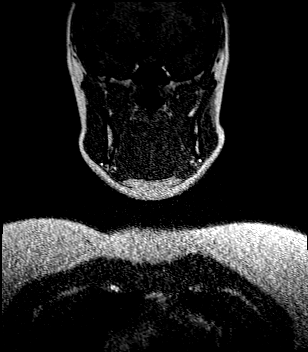
[im 11/96]
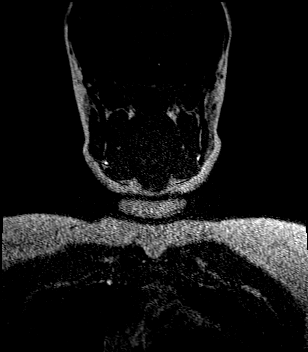
[im 22/96]
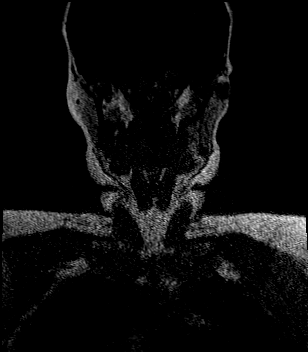
[im 32/96]
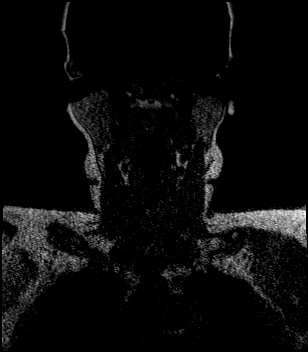
[im 43/96]
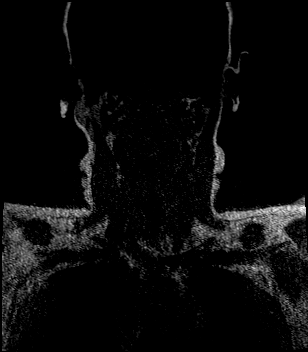
[im 53/96]
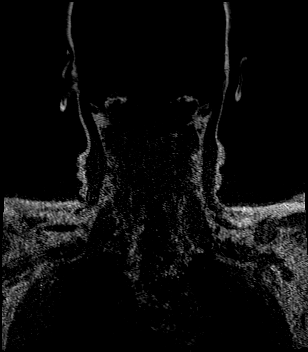
[im 64/96]
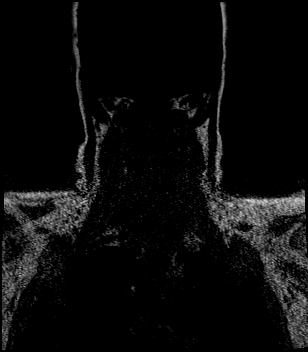
[im 74/96]
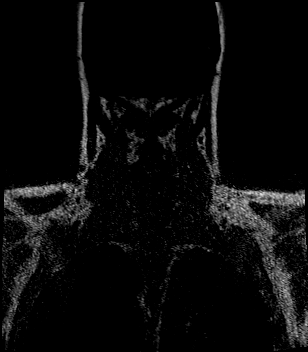
[im 85/96]
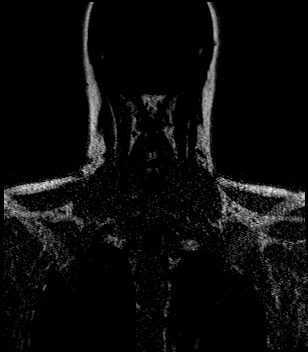
[im 96/96]
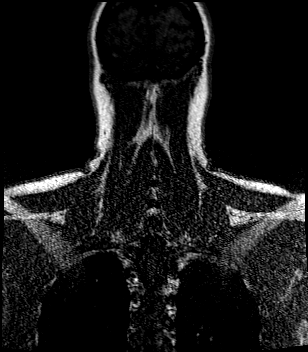

[Series 35: angio_fl3d_cor_post_ttc=2.0s_moco-adv · coronal · 0.9mm · 0.85mm/px · 11 of 96 slices shown]
[im 1/96]
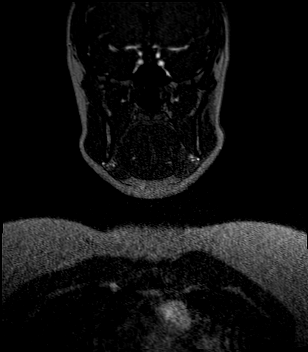
[im 10/96]
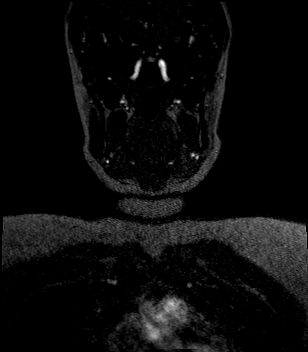
[im 20/96]
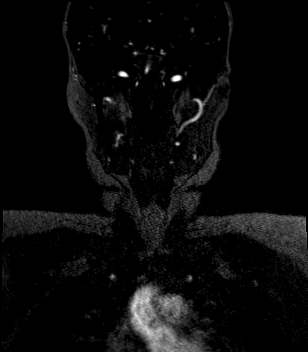
[im 29/96]
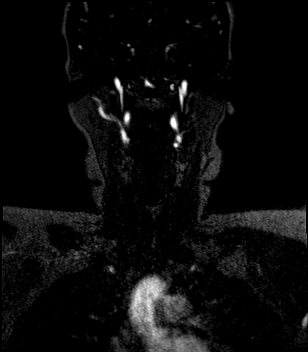
[im 39/96]
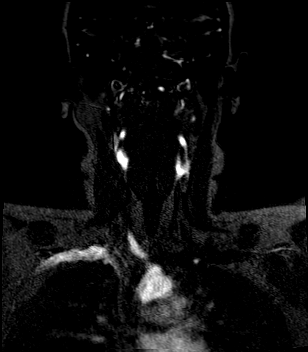
[im 48/96]
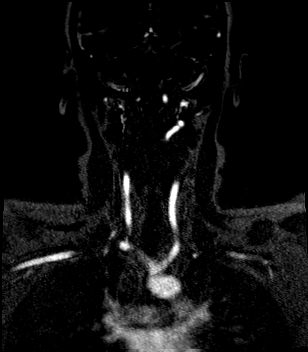
[im 58/96]
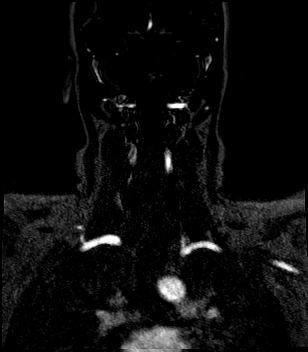
[im 67/96]
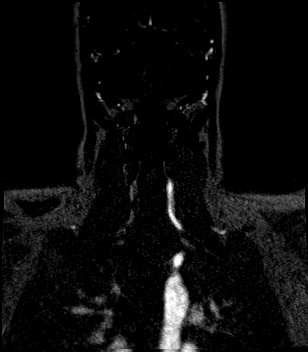
[im 77/96]
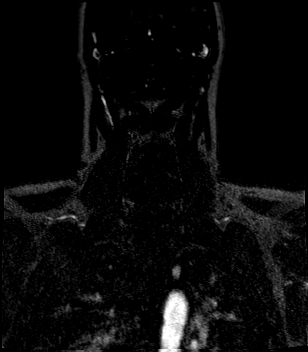
[im 86/96]
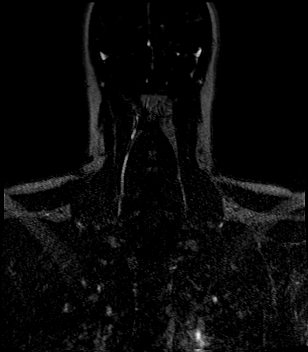
[im 96/96]
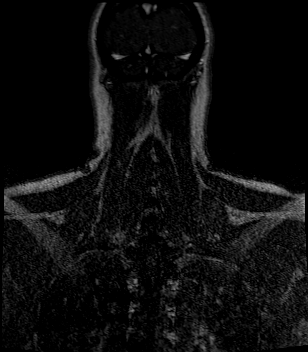

[Series 36: angio_fl3d_cor_post_ttc=2.0s_moco-adv_sub · coronal · 0.9mm · 0.85mm/px · 11 of 95 slices shown]
[im 1/95]
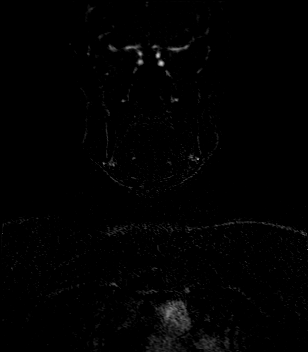
[im 10/95]
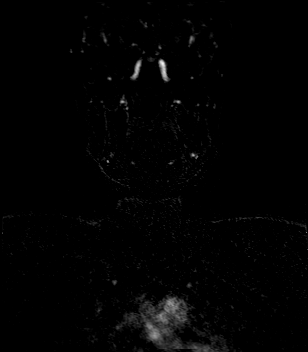
[im 19/95]
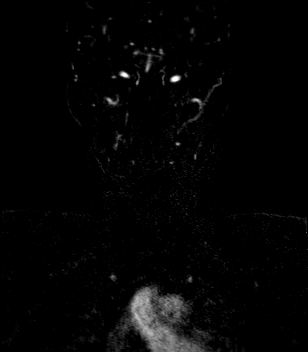
[im 29/95]
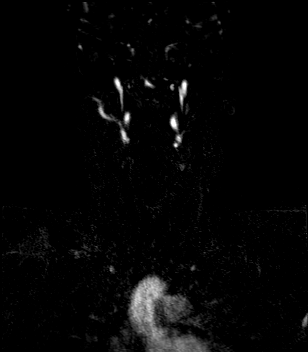
[im 38/95]
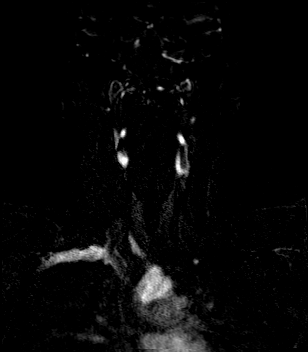
[im 48/95]
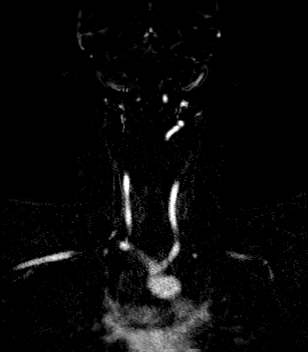
[im 57/95]
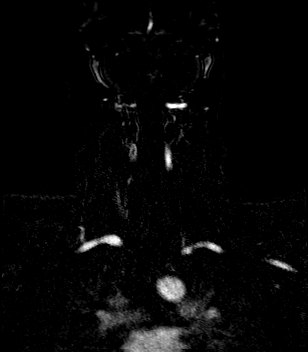
[im 66/95]
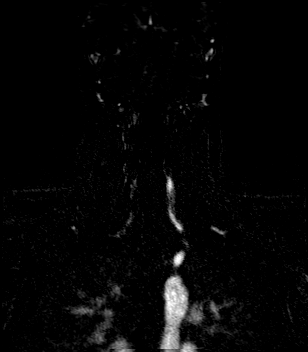
[im 76/95]
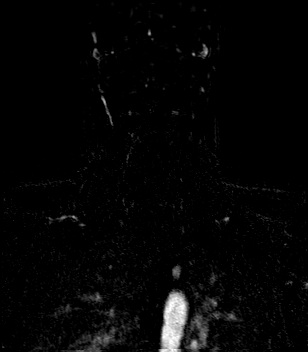
[im 85/95]
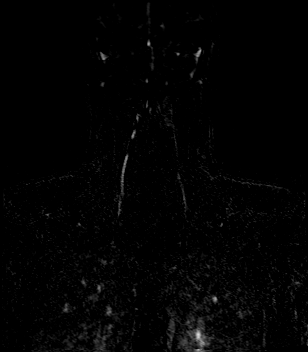
[im 95/95]
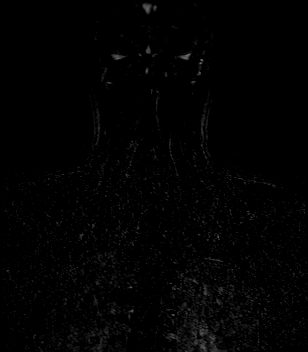

[Series 1151: neck wwo · sagittal · 0.9mm · 0.47mm/px · 1 of 3 slices shown]
[im 1/3]
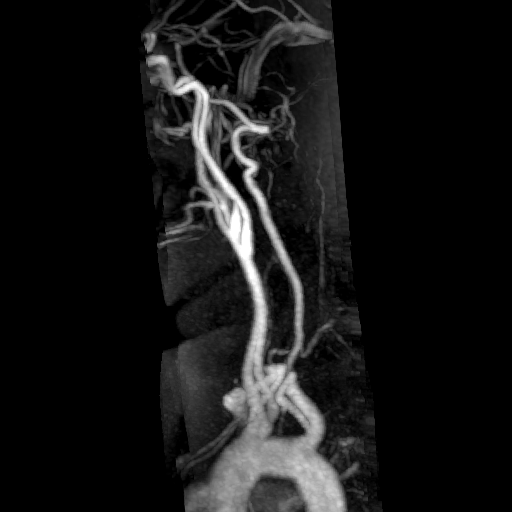

[Series 1161: r carotid · sagittal · 0.9mm · 0.47mm/px · 1 of 3 slices shown]
[im 1/3]
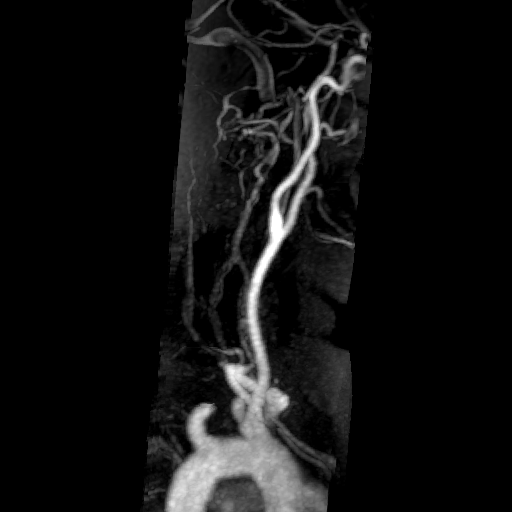

[34 of 48 positions shown; findings below may reference images not displayed]

FINDINGS: MRI HEAD FINDINGS

Brain: Single tiny FLAIR hyperintensity in the frontal white matter,
allowable for age. No generalized findings of white matter disease.
In the deep right hemispheric white matter is a saturating cyst
without adjacent gliosis, consistent with dilated perivascular space
or incidental neural glial cyst. No infarct, hydrocephalus, mass, or
collection. No abnormal enhancement

Vascular: See below

Skull and upper cervical spine: Borderline low marrow signal in the
upper cervical spine the correlates with anemia by labs.

Sinuses/Orbits: Negative

MRA HEAD FINDINGS

Vessels are smooth and widely patent. No branch occlusion, beading,
or aneurysm. Intact circle-of-Willis.

MRA NECK FINDINGS

Antegrade flow in the carotid and vertebral arteries by
time-of-flight imaging.

No incidental finding in the cervical soft tissues on the neck mask.

Postcontrast imaging shows a normal arch and proximal great vessels.
The left vertebral artery is dominant. Right vertebral artery is
largely obscured by venous plexus enhancement on postcontrast
imaging, but is normal appearing where covered by time-of-flight.
Bilateral carotids are smoothly contoured and widely patent.
IMPRESSION: Brain MRI:

No explanation for symptoms. No infarct or generalized white matter
disease.

MRA of the head neck:

Negative.

## 2021-04-25 MED ORDER — VITAMIN D (ERGOCALCIFEROL) 1.25 MG (50000 UNIT) PO CAPS: 50000.0000 [IU] | ORAL_CAPSULE | ORAL | 0 refills | Status: AC

## 2021-04-25 MED ORDER — PRENATAL 27-0.8 MG PO TABS: 1.0000 | ORAL_TABLET | Freq: Every day | ORAL | 0 refills | Status: DC

## 2021-04-25 MED ORDER — VITAMIN B 12 500 MCG PO TABS: 1.0000 | ORAL_TABLET | Freq: Every day | ORAL | 0 refills | Status: DC

## 2021-04-25 MED ORDER — GADOBUTROL 1 MMOL/ML IV SOLN
7.0000 mL | Freq: Once | INTRAVENOUS | Status: AC | PRN
  Administered 2021-04-25: 04:00:00 7 mL via INTRAVENOUS

## 2021-04-25 NOTE — Progress Notes (Signed)
Patient discharged to home with friend. IV removed intact, vital signs stable. All questions answered. Belongings sent home with patient.

## 2021-04-25 NOTE — Consult Note (Signed)
Neurology Consultation Reason for Consult: Left sided numbness Referring Physician: Cinda Quest, P  CC: Left sided numbness.   History is obtained from:patient   HPI: Darlene Moreno is a 35 y.o. female with a history of a single previous episode of left sided numbness presents with left sided numbness yesterday. There was no tingling associated. It initially involved her arm, then spread to her leg as well.  She denies any pain either in the neck or in the areas affected.  She denies any headache or photophobia associated with this event.  It lasted for most of the day, and then the next day she noticed a similar feeling in her left leg.  She now feels like she is back to normal.   Of note, she had a nearly identical problem back in February which also lasted for most of the day and then spontaneously resolved.  It also involves left arm and leg.  She was admitted and an MRI/MRA was negative.   ROS: A 14 point ROS was performed and is negative except as noted in the HPI.  Past Medical History:  Diagnosis Date   Wears contact lenses      Family History  Problem Relation Age of Onset   Hypertension Mother    Diabetes Father    Breast cancer Neg Hx    Ovarian cancer Neg Hx    Colon cancer Neg Hx      Social History:  reports that she has never smoked. She has never used smokeless tobacco. She reports current alcohol use. She reports that she does not use drugs.   Exam: Current vital signs: BP 124/77 (BP Location: Right Arm)   Pulse (!) 104   Temp (!) 97.5 F (36.4 C)   Resp 16   Ht 5\' 1"  (1.549 m)   Wt 78.7 kg   LMP 04/17/2021   SpO2 100%   BMI 32.78 kg/m  Vital signs in last 24 hours: Temp:  [97.5 F (36.4 C)-98.2 F (36.8 C)] 97.5 F (36.4 C) (07/10 1015) Pulse Rate:  [98-130] 104 (07/10 1015) Resp:  [16-18] 16 (07/10 1015) BP: (108-154)/(77-100) 124/77 (07/10 1015) SpO2:  [96 %-100 %] 100 % (07/10 1015) Weight:  [77.1 kg-78.7 kg] 78.7 kg (07/09  2323)   Physical Exam  Constitutional: Appears well-developed and well-nourished.  Psych: Affect appropriate to situation Eyes: No scleral injection HENT: No OP obstruction MSK: no joint deformities.  Cardiovascular: Normal rate and regular rhythm.  Respiratory: Effort normal, non-labored breathing GI: Soft.  No distension. There is no tenderness.  Skin: WDI  Neuro: Mental Status: Patient is awake, alert, oriented to person, place, month, year, and situation. Patient is able to give a clear and coherent history. No signs of aphasia or neglect Cranial Nerves: II: Visual Fields are full. Pupils are equal, round, and reactive to light.   III,IV, VI: EOMI without ptosis or diploplia.  V: Facial sensation is symmetric to temperature VII: Facial movement is symmetric.  VIII: hearing is intact to voice X: Uvula elevates symmetrically XI: Shoulder shrug is symmetric. XII: tongue is midline without atrophy or fasciculations.  Motor: Tone is normal. Bulk is normal. 5/5 strength was present in all four extremities.  Sensory: Sensation is symmetric to light touch and temperature in the arms and legs. Deep Tendon Reflexes: 3+ and symmetric at the patella and brachioradialis Plantars: Toes are downgoing bilaterally.  Cerebellar: FNF intact bilaterally    I have reviewed labs in epic and the results pertinent to this  consultation are: LDL 95  I have reviewed the images obtained: MRI brain-negative  Impression: 35 year old female with transient left-sided hypoesthesia which is now resolved.  Given the duration the symptoms lasted, I would expect MRI to be positive if this was cerebrovascular in nature.  Also, if this represented TIAs, I would not expect identical symptoms several months apart unless there was some type of focal stenosis on MRI.  With no clear risk factors, I do not think I would continue down the cerebrovascular pathway for the symptoms.  She is slightly  hyperreflexic, and therefore it would not be unreasonable to do a cervical spine MRI, though with resolution of her symptoms I do not think that there is any urgency to this and this could be done as an outpatient.  Finally, even though she does not have a history of migraines or headaches, migraine aura without headache can cause episodic symptoms such as what she has and is a consideration as well.  At this point, I think other than a cervical spine MRI that further investigations will be fairly low yield and therefore do not recommend any further inpatient work-up at this time   I feel that we can cancel the echocardiogram and I would not continue antiplatelet therapy long-term unless further evidence of a cerebrovascular etiology is obtained.  recommendations: 1) MRI cervical spine without contrast, this could be performed either as inpatient or outpatient 2) no further recommendations at this time, please call with further questions or concerns.   Roland Rack, MD [facility] [phone]  If 7pm- 7am, please page neurology on call as listed in [place].

## 2021-04-25 NOTE — Discharge Summary (Signed)
[facility] Discharge Summary   Patient: Darlene Moreno AVW:[id number]  PCP: Pcp, No  Date of admission: 04/24/2021   Date of discharge:  04/25/2021     Discharge Diagnoses:  Principal Problem:   Numbness and tingling of left arm and leg   Admitted From: Hom,e Disposition:  Home   Recommendations for Outpatient Follow-up:  PCP: in 1 wk Neurology in 1 wk Follow up LABS/TEST:  f/u B12 and Vit d level, MRI c-spine as out patient    Diet recommendation: Regular diet  Activity: The patient is advised to gradually reintroduce usual activities, as tolerated  Discharge Condition: stable  Code Status: Full code   History of present illness: As per the H and P dictated on admission Darlene Moreno is a 35 y.o. female with no known significant medical history who presented to the ED for evaluation of left-sided numbness. Patient initially noticed numbness of her left arm from her elbow to her wrist medially yesterday (7/8).  Numbness lasted most of the day before resolving on its own.  This morning she is now experiencing numbness in the posterior aspect of her left leg from just above the knee down to the calf.  She reports continued subjective numbness behind her knee.  She has not had any associated weakness, lightheadedness/dizziness, headache, change in vision, nausea, vomiting, chest pain, dyspnea, abdominal pain, dysuria. She does report similar numbness in her left arm which occurred after she had a miscarriage 5 months ago.  At the time this was attributed to possible stress reaction.  She reports occasional anxiety which causes some chest tightness but no chest pain. She denies any tobacco use.  She reports occasional alcohol use on social occasions.  She denies any illicit drug use.  She reports a history of diabetes in her father. ED Course:  Initial vitals showed BP 154/100, pulse 110, RR 18, temp 98.0 F, SPO2 100% on room air. Labs show sodium 136, potassium 3.6, bicarb 24,  BUN 15, creatinine 0.82, serum glucose 128, WBC 6.4, hemoglobin 13.2, platelets 203,000. Neurology were consulted and recommended medical admission for stroke work-up.  MRI brain, MRA head and neck, and TTE bubble study ordered and pending.  Neurology will see in formal consultation in the morning.  Patient was given aspirin 324 mg.  The hospitalist service was consulted to admit for further evaluation and management. Hospital Course:  # Numbness of left arm and leg: Migratory left upper and lower extremity subjective numbness.  Neurology requested, MRI brain, MRA head and neck negative, no acute findings. Echocardiogram ordered but was pending.  Neurology recommended no need of echocardiogram.  Patient was given aspirin but no neurological event was suspected, no need of aspirin.  Lipid profile LDL 95, # Anxiety: Patient reports feeling anxious.  Tachycardia likely related to anxiety. S/p Atarax as needed during hospital stay.  Patient was advised to follow with PCP if persistent problem with anxiety and palpitations.  TSH within normal range. # Iron saturation 10% slightly low, patient was advised to continue prenatal multivitamins with iron supplement. # Vitamin D insufficiency, vitamin D level 21, started vitamin D 50,000 units p.o. weekly, follow with PCP to repeat vitamin D level after 3 months # Vitamin B12 level 318-- 360, target >400, started vitamin B12 supplement, follow with PCP to repeat vitamin B-12 level after 3 months. # Obesity, body mass index is 32.78 kg/m.  Diet exercise and calorie regular diet advised to lose body weight    - Patient was  instructed, not to drive, operate heavy machinery, perform activities at heights, swimming or participation in water activities or provide baby sitting services while on Pain, Sleep and Anxiety Medications; until her outpatient Physician has advised to do so again.  - Also recommended to not to take more than prescribed Pain, Sleep and Anxiety  Medications.  Patient was ambulatory without any assistance. On the day of the discharge the patient's vitals were stable, and no other acute medical condition were reported by patient. the patient was felt safe to be discharge at Home.  Consultants: Neurology Procedures: None  Discharge Exam: General: Appear in no distress, no rashes Rash; Oral Mucosa Clear, moist. Cardiovascular: S1 and S2 Present, no Murmur, Respiratory: normal respiratory effort, Bilateral Air entry present and no Crackles, no wheezes Abdomen: Bowel Sound present, Soft and no tenderness, no hernia Extremities: no Pedal edema, no calf tenderness Neurology: alert and oriented to time, place, and person affect appropriate.  Filed Weights   04/24/21 1637 04/24/21 2323  Weight: 77.1 kg 78.7 kg   Vitals:   04/25/21 1015 04/25/21 1215  BP: 124/77 129/82  Pulse: (!) 104 (!) 108  Resp: 16 16  Temp: (!) 97.5 F (36.4 C) 98.6 F (37 C)  SpO2: 100% 97%    DISCHARGE MEDICATION: Allergies as of 04/25/2021       Reactions   Amoxicillin Rash        Medication List     TAKE these medications    multivitamin-prenatal 27-0.8 MG Tabs tablet Take 1 tablet by mouth daily at 12 noon.       Allergies  Allergen Reactions   Amoxicillin Rash   Discharge Instructions     Call MD for:   Complete by: As directed    Numbness or weakness   Call MD for:  difficulty breathing, headache or visual disturbances   Complete by: As directed    Call MD for:  extreme fatigue   Complete by: As directed    Call MD for:  persistant dizziness or light-headedness   Complete by: As directed    Call MD for:  severe uncontrolled pain   Complete by: As directed    Call MD for:  temperature >100.4   Complete by: As directed    Diet - low sodium heart healthy   Complete by: As directed    Discharge instructions   Complete by: As directed    Follow-up with PCP in 1 week, follow-up vitamin B12 and vitamin D levels which are  still in process.  MRI cervical spine as an outpatient Follow-up with neurology in 1 to 2 weeks as an outpatient   Increase activity slowly   Complete by: As directed        The results of significant diagnostics from this hospitalization (including imaging, microbiology, ancillary and laboratory) are listed below for reference.    Significant Diagnostic Studies: MR ANGIO HEAD WO CONTRAST  Result Date: 04/25/2021 CLINICAL DATA:  Multiple sclerosis with new event. Left-sided numbness EXAM: MRI HEAD WITHOUT AND WITH CONTRAST MRA HEAD WITHOUT CONTRAST MRA NECK WITHOUT AND WITH CONTRAST TECHNIQUE: Multiplanar, multiecho pulse sequences of the brain and surrounding structures were obtained without and with intravenous contrast. Angiographic images of the Circle of Willis were obtained using MRA technique without intravenous contrast. Angiographic images of the neck were obtained using MRA technique without and with intravenous contrast. Carotid stenosis measurements (when applicable) are obtained utilizing NASCET criteria, using the distal internal carotid diameter as the denominator. CONTRAST:  15mL GADAVIST GADOBUTROL 1 MMOL/ML IV SOLN COMPARISON:  None. FINDINGS: MRI HEAD FINDINGS Brain: Single tiny FLAIR hyperintensity in the frontal white matter, allowable for age. No generalized findings of white matter disease. In the deep right hemispheric white matter is a saturating cyst without adjacent gliosis, consistent with dilated perivascular space or incidental neural glial cyst. No infarct, hydrocephalus, mass, or collection. No abnormal enhancement Vascular: See below Skull and upper cervical spine: Borderline low marrow signal in the upper cervical spine the correlates with anemia by labs. Sinuses/Orbits: Negative MRA HEAD FINDINGS Vessels are smooth and widely patent. No branch occlusion, beading, or aneurysm. Intact circle-of-Willis. MRA NECK FINDINGS Antegrade flow in the carotid and vertebral arteries  by time-of-flight imaging. No incidental finding in the cervical soft tissues on the neck mask. Postcontrast imaging shows a normal arch and proximal great vessels. The left vertebral artery is dominant. Right vertebral artery is largely obscured by venous plexus enhancement on postcontrast imaging, but is normal appearing where covered by time-of-flight. Bilateral carotids are smoothly contoured and widely patent. IMPRESSION: Brain MRI: No explanation for symptoms. No infarct or generalized white matter disease. MRA of the head neck: Negative. Electronically Signed   By: Monte Fantasia M.D.   On: 04/25/2021 04:35   MR ANGIO NECK W WO CONTRAST  Result Date: 04/25/2021 CLINICAL DATA:  Multiple sclerosis with new event. Left-sided numbness EXAM: MRI HEAD WITHOUT AND WITH CONTRAST MRA HEAD WITHOUT CONTRAST MRA NECK WITHOUT AND WITH CONTRAST TECHNIQUE: Multiplanar, multiecho pulse sequences of the brain and surrounding structures were obtained without and with intravenous contrast. Angiographic images of the Circle of Willis were obtained using MRA technique without intravenous contrast. Angiographic images of the neck were obtained using MRA technique without and with intravenous contrast. Carotid stenosis measurements (when applicable) are obtained utilizing NASCET criteria, using the distal internal carotid diameter as the denominator. CONTRAST:  13mL GADAVIST GADOBUTROL 1 MMOL/ML IV SOLN COMPARISON:  None. FINDINGS: MRI HEAD FINDINGS Brain: Single tiny FLAIR hyperintensity in the frontal white matter, allowable for age. No generalized findings of white matter disease. In the deep right hemispheric white matter is a saturating cyst without adjacent gliosis, consistent with dilated perivascular space or incidental neural glial cyst. No infarct, hydrocephalus, mass, or collection. No abnormal enhancement Vascular: See below Skull and upper cervical spine: Borderline low marrow signal in the upper cervical spine the  correlates with anemia by labs. Sinuses/Orbits: Negative MRA HEAD FINDINGS Vessels are smooth and widely patent. No branch occlusion, beading, or aneurysm. Intact circle-of-Willis. MRA NECK FINDINGS Antegrade flow in the carotid and vertebral arteries by time-of-flight imaging. No incidental finding in the cervical soft tissues on the neck mask. Postcontrast imaging shows a normal arch and proximal great vessels. The left vertebral artery is dominant. Right vertebral artery is largely obscured by venous plexus enhancement on postcontrast imaging, but is normal appearing where covered by time-of-flight. Bilateral carotids are smoothly contoured and widely patent. IMPRESSION: Brain MRI: No explanation for symptoms. No infarct or generalized white matter disease. MRA of the head neck: Negative. Electronically Signed   By: Monte Fantasia M.D.   On: 04/25/2021 04:35   MR BRAIN W WO CONTRAST  Result Date: 04/25/2021 CLINICAL DATA:  Multiple sclerosis with new event. Left-sided numbness EXAM: MRI HEAD WITHOUT AND WITH CONTRAST MRA HEAD WITHOUT CONTRAST MRA NECK WITHOUT AND WITH CONTRAST TECHNIQUE: Multiplanar, multiecho pulse sequences of the brain and surrounding structures were obtained without and with intravenous contrast. Angiographic images of the Circle of  Willis were obtained using MRA technique without intravenous contrast. Angiographic images of the neck were obtained using MRA technique without and with intravenous contrast. Carotid stenosis measurements (when applicable) are obtained utilizing NASCET criteria, using the distal internal carotid diameter as the denominator. CONTRAST:  32mL GADAVIST GADOBUTROL 1 MMOL/ML IV SOLN COMPARISON:  None. FINDINGS: MRI HEAD FINDINGS Brain: Single tiny FLAIR hyperintensity in the frontal white matter, allowable for age. No generalized findings of white matter disease. In the deep right hemispheric white matter is a saturating cyst without adjacent gliosis, consistent  with dilated perivascular space or incidental neural glial cyst. No infarct, hydrocephalus, mass, or collection. No abnormal enhancement Vascular: See below Skull and upper cervical spine: Borderline low marrow signal in the upper cervical spine the correlates with anemia by labs. Sinuses/Orbits: Negative MRA HEAD FINDINGS Vessels are smooth and widely patent. No branch occlusion, beading, or aneurysm. Intact circle-of-Willis. MRA NECK FINDINGS Antegrade flow in the carotid and vertebral arteries by time-of-flight imaging. No incidental finding in the cervical soft tissues on the neck mask. Postcontrast imaging shows a normal arch and proximal great vessels. The left vertebral artery is dominant. Right vertebral artery is largely obscured by venous plexus enhancement on postcontrast imaging, but is normal appearing where covered by time-of-flight. Bilateral carotids are smoothly contoured and widely patent. IMPRESSION: Brain MRI: No explanation for symptoms. No infarct or generalized white matter disease. MRA of the head neck: Negative. Electronically Signed   By: Monte Fantasia M.D.   On: 04/25/2021 04:35    Microbiology: Recent Results (from the past 240 hour(s))  Resp Panel by RT-PCR (Flu A&B, Covid) Urine, Clean Catch     Status: None   Collection Time: 04/24/21  7:05 PM   Specimen: Urine, Clean Catch; Nasopharyngeal(NP) swabs in vial transport medium  Result Value Ref Range Status   SARS Coronavirus 2 by RT PCR NEGATIVE NEGATIVE Final    Comment: (NOTE) SARS-CoV-2 target nucleic acids are NOT DETECTED.  The SARS-CoV-2 RNA is generally detectable in upper respiratory specimens during the acute phase of infection. The lowest concentration of SARS-CoV-2 viral copies this assay can detect is 138 copies/mL. A negative result does not preclude SARS-Cov-2 infection and should not be used as the sole basis for treatment or other patient management decisions. A negative result may occur with  improper  specimen collection/handling, submission of specimen other than nasopharyngeal swab, presence of viral mutation(s) within the areas targeted by this assay, and inadequate number of viral copies(<138 copies/mL). A negative result must be combined with clinical observations, patient history, and epidemiological information. The expected result is Negative.  Fact Sheet for Patients:  EntrepreneurPulse.com.au  Fact Sheet for Healthcare Providers:  IncredibleEmployment.be  This test is no t yet approved or cleared by the [place] FDA and  has been authorized for detection and/or diagnosis of SARS-CoV-2 by FDA under an Emergency Use Authorization (EUA). This EUA will remain  in effect (meaning this test can be used) for the duration of the COVID-19 declaration under Section 564(b)(1) of the Act, 21 U.S.C.section 360bbb-3(b)(1), unless the authorization is terminated  or revoked sooner.       Influenza A by PCR NEGATIVE NEGATIVE Final   Influenza B by PCR NEGATIVE NEGATIVE Final    Comment: (NOTE) The Xpert Xpress SARS-CoV-2/FLU/RSV plus assay is intended as an aid in the diagnosis of influenza from Nasopharyngeal swab specimens and should not be used as a sole basis for treatment. Nasal washings and aspirates are unacceptable for Xpert  Xpress SARS-CoV-2/FLU/RSV testing.  Fact Sheet for Patients: EntrepreneurPulse.com.au  Fact Sheet for Healthcare Providers: IncredibleEmployment.be  This test is not yet approved or cleared by the [place] FDA and has been authorized for detection and/or diagnosis of SARS-CoV-2 by FDA under an Emergency Use Authorization (EUA). This EUA will remain in effect (meaning this test can be used) for the duration of the COVID-19 declaration under Section 564(b)(1) of the Act, 21 U.S.C. section 360bbb-3(b)(1), unless the authorization is terminated or revoked.  Performed at  [facility], [address] [place], [place] [address]      Labs: CBC: Recent Labs  Lab 04/24/21 1642 04/25/21 0654 04/25/21 0827  WBC 6.4 6.8 6.7  HGB 13.2 12.7 12.7  HCT 42.0 39.8 39.0  MCV 77.3* 76.5* 75.3*  PLT 283 279 169   Basic Metabolic Panel: Recent Labs  Lab 04/24/21 1642 04/25/21 0654 04/25/21 0827  NA 136 138 137  K 3.6 3.7 3.5  CL 105 107 107  CO2 24 25 22   GLUCOSE 128* 99 102*  BUN 15 12 11   CREATININE 0.82 0.66 0.71  CALCIUM 8.6* 8.5* 8.5*  MG  --   --  2.1  PHOS  --   --  2.4*   Liver Function Tests: No results for input(s): AST, ALT, ALKPHOS, BILITOT, PROT, ALBUMIN in the last 168 hours. No results for input(s): LIPASE, AMYLASE in the last 168 hours. No results for input(s): AMMONIA in the last 168 hours. Cardiac Enzymes: No results for input(s): CKTOTAL, CKMB, CKMBINDEX, TROPONINI in the last 168 hours. BNP (last 3 results) No results for input(s): BNP in the last 8760 hours. CBG: No results for input(s): GLUCAP in the last 168 hours.  Time spent: 35 minutes  Signed:  Val Riles  [facility]  04/25/2021 2:19 PM

## 2021-04-25 NOTE — Progress Notes (Signed)
PT Cancellation Note  Patient Details Name: Darlene Moreno MRN: [id number] DOB: [birth year]   Cancelled Treatment:    Reason Eval/Treat Not Completed: PT screened, no needs identified, will sign off PT orders received, chart reviewed. Nurse reports pt is independent with ambulation in the room. Pt reports her numbness/tingling symptoms have resolved & she's at baseline in regards to gait, stairs & balance. Pt denies any concerns re: mobility at this time. PT to sign off. Please re-consult if new needs arise.  Lavone Nian, PT, DPT 04/25/21, 9:24 AM    Waunita Schooner 04/25/2021, 9:23 AM

## 2021-04-25 NOTE — Progress Notes (Signed)
   04/25/21 0810  Assess: MEWS Score  Temp 98.2 F (36.8 C)  BP 118/84  Pulse Rate (!) 130  Resp 16  Level of Consciousness Alert  SpO2 100 %  O2 Device Room Air  Assess: MEWS Score  MEWS Temp 0  MEWS Systolic 0  MEWS Pulse 3  MEWS RR 0  MEWS LOC 0  MEWS Score 3  MEWS Score Color Yellow  Assess: if the MEWS score is Yellow or Red  Were vital signs taken at a resting state? Yes  Focused Assessment No change from prior assessment  Does the patient meet 2 or more of the SIRS criteria? No  MEWS guidelines implemented *See Row Information* Yes  Treat  MEWS Interventions Administered prn meds/treatments  Pain Scale 0-10  Pain Score 0  Complains of Anxiety  Take Vital Signs  Increase Vital Sign Frequency  Yellow: Q 2hr X 2 then Q 4hr X 2, if remains yellow, continue Q 4hrs  Escalate  MEWS: Escalate Yellow: discuss with charge nurse/RN and consider discussing with provider and RRT  Notify: Charge Nurse/RN  Name of Charge Nurse/RN Notified Doloris Hall  Date Charge Nurse/RN Notified 04/25/21  Time Charge Nurse/RN Notified 0810  Document  Patient Outcome Other (Comment) (Will continue to monitor)  Assess: SIRS CRITERIA  SIRS Temperature  0  SIRS Pulse 1  SIRS Respirations  0  SIRS WBC 0  SIRS Score Sum  1

## 2021-06-23 ENCOUNTER — Other Ambulatory Visit: Payer: Self-pay

## 2021-06-23 ENCOUNTER — Other Ambulatory Visit: Payer: BC Managed Care – PPO

## 2021-06-23 ENCOUNTER — Telehealth: Payer: Self-pay | Admitting: Obstetrics and Gynecology

## 2021-06-23 DIAGNOSIS — Z8759 Personal history of other complications of pregnancy, childbirth and the puerperium: Secondary | ICD-10-CM

## 2021-06-23 DIAGNOSIS — Z3200 Encounter for pregnancy test, result unknown: Secondary | ICD-10-CM

## 2021-06-23 NOTE — Telephone Encounter (Signed)
Pt called and was informed that [facility] would like for her to have beta and progesterone levels checked. Pt stated yesterday that she noticed light pink bleeding once, light cramping pain level 2 no bleeding today seen it yesterday only. LMP 05/14/2021 pt [redacted]w[redacted]d

## 2021-06-23 NOTE — Telephone Encounter (Signed)
Patient states that she took a at home pregnancy test and it was positive.  She has started to cramp and spot some (light pink in color) patient states that she is concerned with her history of miscarriages.  She is a previous Darlene Moreno patient.  Patient is wanting to know if we can check her blood levels while she waits on an appointment.  Sharyn Lull and patient had discussed Progesterone injections after her last miscarriage...please advise

## 2021-06-24 LAB — PROGESTERONE: Progesterone: 12 ng/mL

## 2021-06-24 LAB — BETA HCG QUANT (REF LAB): hCG Quant: 50537 m[IU]/mL

## 2021-06-24 MED ORDER — PROGESTERONE 200 MG PO CAPS: 200.0000 mg | ORAL_CAPSULE | Freq: Two times a day (BID) | ORAL | 3 refills | Status: DC

## 2021-07-08 ENCOUNTER — Other Ambulatory Visit: Payer: Self-pay

## 2021-07-08 ENCOUNTER — Ambulatory Visit: Payer: BC Managed Care – PPO

## 2021-07-08 VITALS — BP 132/86 | HR 109 | Ht 61.0 in | Wt 179.0 lb

## 2021-07-08 DIAGNOSIS — Z113 Encounter for screening for infections with a predominantly sexual mode of transmission: Secondary | ICD-10-CM | POA: Diagnosis not present

## 2021-07-08 DIAGNOSIS — Z0283 Encounter for blood-alcohol and blood-drug test: Secondary | ICD-10-CM | POA: Diagnosis not present

## 2021-07-08 DIAGNOSIS — R638 Other symptoms and signs concerning food and fluid intake: Secondary | ICD-10-CM

## 2021-07-08 DIAGNOSIS — Z3481 Encounter for supervision of other normal pregnancy, first trimester: Secondary | ICD-10-CM

## 2021-07-08 DIAGNOSIS — Z3A01 Less than 8 weeks gestation of pregnancy: Secondary | ICD-10-CM

## 2021-07-08 NOTE — Patient Instructions (Addendum)
Morning Sickness Morning sickness is when you feel like you may vomit (feel nauseous) during pregnancy. Sometimes, you may vomit. Morning sickness most often happens in the morning, but it can also happen at any time of the day. Some women may have morning sickness that makes them vomit all the time. This is a more serious problem that needs treatment. What are the causes? The cause of this condition is not known. What increases the risk? You had vomiting or a feeling like you may vomit before your pregnancy. You had morning sickness in another pregnancy. You are pregnant with more than one baby, such as twins. What are the signs or symptoms? Feeling like you may vomit. Vomiting. How is this treated? Treatment is usually not needed for this condition. You may only need to change what you eat. In some cases, your doctor may give you some things to take for your condition. These include: Vitamin B6 supplements. Medicines to treat the feeling that you may vomit. Ginger. Follow these instructions at home: Medicines Take over-the-counter and prescription medicines only as told by your doctor. Do not take any medicines until you talk with your doctor about them first. Take multivitamins before you get pregnant. These can stop or lessen the symptoms of morning sickness. Eating and drinking Eat dry toast or crackers before getting out of bed. Eat 5 or 6 small meals a day. Eat dry and bland foods like rice and baked potatoes. Do not eat greasy, fatty, or spicy foods. Have someone cook for you if the smell of food causes you to vomit or to feel like you may vomit. If you feel like you may vomit after taking prenatal vitamins, take them at night or with a snack. Eat protein foods when you need a snack. Nuts, yogurt, and cheese are good choices. Drink fluids throughout the day. Try ginger ale made with real ginger, ginger tea made from fresh grated ginger, or ginger candies. General  instructions Do not smoke or use any products that contain nicotine or tobacco. If you need help quitting, ask your doctor. Use an air purifier to keep the air in your house free of smells. Get lots of fresh air. Try to avoid smells that make you feel sick. Try wearing an acupressure wristband. This is a wristband that is used to treat seasickness. Try a treatment called acupuncture. In this treatment, a doctor puts needles into certain areas of your body to make you feel better. Contact a doctor if: You need medicine to feel better. You feel dizzy or light-headed. You are losing weight. Get help right away if: The feeling that you may vomit will not go away, or you cannot stop vomiting. You faint. You have very bad pain in your belly. Summary Morning sickness is when you feel like you may vomit (feel nauseous) during pregnancy. You may feel sick in the morning, but you can feel this way at any time of the day. Making some changes to what you eat may help your symptoms go away. This information is not intended to replace advice given to you by your health care provider. Make sure you discuss any questions you have with your health care provider. Document Revised: 05/18/2020 Document Reviewed: 04/27/2020 Elsevier Patient Education  [place]. WHAT OB PATIENTS CAN EXPECT  Confirmation of pregnancy and ultrasound ordered if medically indicated-[redacted] weeks gestation New OB (NOB) intake with nurse and New OB (NOB) labs- [redacted] weeks gestation New OB (NOB) physical examination with provider- 11/[redacted]  weeks gestation Flu vaccine-[redacted] weeks gestation Anatomy scan-[redacted] weeks gestation Glucose tolerance test, blood work to test for anemia, T-dap vaccine-[redacted] weeks gestation Vaginal swabs/cultures-STD/Group B strep-[redacted] weeks gestation Appointments every 4 weeks until 28 weeks Every 2 weeks from 28 weeks until 36 weeks Weekly visits from 36 weeks until delivery  Tests and Screening During  Pregnancy Having certain tests and screenings during pregnancy is an important part of your prenatal care. These tests help your health care provider find problems that might affect your pregnancy. Some tests must be done for all pregnant women, and some are optional. Most of the tests and screenings do not pose any risks for you or your baby. You may need additional testing if any routine tests indicate a problem. Tests and screenings done early in pregnancy Some tests and screenings you can expect to have in early pregnancy include: Blood tests, such as: Complete blood count (CBC). This test is done to check your red and white blood cells. It can help identify a risk for anemia, infection, or bleeding. Blood typing. This test shows your blood type. It also shows whether you have a certain protein in your red blood cells called the Rh factor. It can be dangerous for your baby if you do not have this protein (Rh negative) and your baby has it (Rh positive). Tests to check for diseases that can cause birth defects or can be passed to your baby, such as: [place] measles (rubella) and chicken pox. The test indicates whether you are immune to these diseases. Hepatitis B and C. Human Immunodeficiency Virus (HIV). Syphilis. Zika virus, if you or your partner has traveled to an area where the virus occurs. Urine testing. This checks for sugar in your urine and for signs of infection. Blood pressure. This is to check for high blood pressure and preeclampsia. Testing for sexually transmitted infections (STIs), such as chlamydia or gonorrhea. Testing for tuberculosis. You may have this skin test if you are at risk for tuberculosis. Fetal ultrasound. This is an imaging study of your growing baby. It uses sound waves to create pictures of your baby. This test may be done to help determine your due date and to ensure you do not have an ectopic pregnancy. An ectopic pregnancy is a pregnancy that grows outside of  the uterus. Tests and screenings done later in pregnancy Certain tests are done for the first time later in the pregnancy. Some of the tests that were done in early pregnancy are repeated at this time. Some common tests you can expect to have later in pregnancy include: Rh antibody testing. If you are Rh negative, you will have a blood test at about 28 weeks of pregnancy to see if you are producing Rh antibodies. If you have not started to make antibodies, you will be given an injection to prevent you from making antibodies for the rest of your pregnancy. Glucose screening. This checks your blood sugar. It will show whether you are developing the type of diabetes that occurs during pregnancy (gestational diabetes). You may have this screening earlier if you have risk factors for diabetes. Screening for group B streptococcus (GBS). GBS is a type of bacteria that may live in your rectum or vagina. GBS can spread to your baby during birth. This is done at 35-37 weeks of pregnancy. If testing is positive for GBS, you may be treated with antibiotic medicine. Urine and blood tests to monitor for other pregnancy problems, such as preeclampsia or anemia. Blood pressure  to monitor for high blood pressure and preeclampsia. Fetal ultrasound. This may be repeated at 16-20 weeks to check how your baby is growing and developing. Non-stress test. This test is done later in pregnancy to check your baby's heart rate. This may be repeated weekly if your pregnancy is high risk. Biophysical profile. This test includes ultrasound imaging and a non-stress test to ensure your baby is healthy. This test may help decide when your baby should be born. Screening for birth defects Some birth defects are caused by abnormal genes passed down through families. Early in your pregnancy, tests can be done to find out if your baby is at risk for a genetic disorder. This testing is optional. The type of testing recommended for you will  depend on your family and medical history, your ethnicity, and your age. Testing may include: Screening tests. These tests may include an ultrasound, blood tests, or a combination of both. The blood tests are used to check for abnormal genes and the ultrasound is done to look for early birth defects. Carrier screening. This test involves checking the blood or saliva of both parents to see if they carry abnormal genes that could be passed down to a baby. If genetic screening shows that your baby is at risk for a genetic defect, additional diagnostic testing may be recommended, such as: Amniocentesis. This involves testing a sample of fluid from your womb (amniotic fluid). Chorionic villus sampling. In this test, a sample of cells from your placenta is checked for abnormal cells. Unlike other tests done during pregnancy, diagnostic testing does have some risk for your pregnancy. Talk to your health care provider about the risks and benefits of genetic testing. Questions to ask your health care provider What routine tests are recommended for me? When and how will these tests be done? When will I get the results of routine tests? What do the results of these tests mean for me or my baby? Do you recommend any genetic screening tests? Which ones? Should I see a genetic counselor before having genetic screening? Where to find more information American Pregnancy Association: americanpregnancy.org/prenatal-testing [employer] of Obstetricians and Gynecologists: JewelryExec.com.pt Office on [employer] Health: KeywordPortfolios.com.br March of Dimes: marchofdimes.org/pregnancy Summary Having certain tests and screenings during pregnancy is an important part of your prenatal care. Talk to your health care provider about what tests are right for you and your baby. In early pregnancy, testing may be done to check your risks for various conditions that can affect you and your baby. Later in pregnancy, tests  may be done to ensure that your baby is growing normally and that you and your baby are staying healthy during the pregnancy. Genetic testing is optional. Talk to your health care provider about the risks and benefits of genetic testing. This information is not intended to replace advice given to you by your health care provider. Make sure you discuss any questions you have with your health care provider. Document Revised: 06/23/2020 Document Reviewed: 06/23/2020 Elsevier Patient Education  2022 [employer]. Prenatal Care Prenatal care is health care during pregnancy. It helps you and your unborn baby (fetus) stay as healthy as possible. Prenatal care may be provided by a midwife, a family practice doctor, a IT consultant (nurse practitioner or physician assistant), or a childbirth and pregnancy doctor (obstetrician). How does this affect me? During pregnancy, you will be closely monitored for any new conditions that might develop. To lower your risk of pregnancy complications, you and your health care  provider will talk about any underlying conditions you have. How does this affect my baby? Early and consistent prenatal care increases the chance that your baby will be healthy during pregnancy. Prenatal care lowers the risk that your baby will be: Born early (prematurely). Smaller than expected at birth (small for gestational age). What can I expect at the first prenatal care visit? Your first prenatal care visit will likely be the longest. You should schedule your first prenatal care visit as soon as you know that you are pregnant. Your first visit is a good time to talk about any questions or concerns you have about pregnancy. Medical history At your visit, you and your health care provider will talk about your medical history, including: Any past pregnancies. Your family's medical history. Medical history of the baby's father. Any long-term (chronic) health conditions you have and  how you manage them. Any surgeries or procedures you have had. Any current over-the-counter or prescription medicines, herbs, or supplements that you are taking. Other factors that could pose a risk to your baby, including: Exposure to harmful chemicals or radiation at work or at home. Any substance use, including tobacco, alcohol, and drug use. Your home setting and your stress levels, including: Exposure to abuse or violence. Household financial strain. Your daily health habits, including diet and exercise. Tests and screenings Your health care provider will: Measure your weight, height, and blood pressure. Do a physical exam, including a pelvic and breast exam. Perform blood tests and urine tests to check for: Urinary tract infection. Sexually transmitted infections (STIs). Low iron levels in your blood (anemia). Blood type and certain proteins on red blood cells (Rh antibodies). Infections and immunity to viruses, such as hepatitis B and rubella. HIV (human immunodeficiency virus). Discuss your options for genetic screening. Tips about staying healthy Your health care provider will also give you information about how to keep yourself and your baby healthy, including: Nutrition and taking vitamins. Physical activity. How to manage pregnancy symptoms such as nausea and vomiting (morning sickness). Infections and substances that may be harmful to your baby and how to avoid them. Food safety. Dental care. Working. Travel. Warning signs to watch for and when to call your health care provider. How often will I have prenatal care visits? After your first prenatal care visit, you will have regular visits throughout your pregnancy. The visit schedule is often as follows: Up to week 28 of pregnancy: once every 4 weeks. 28-36 weeks: once every 2 weeks. After 36 weeks: every week until delivery. Some women may have visits more or less often depending on any underlying health conditions  and the health of the baby. Keep all follow-up and prenatal care visits. This is important. What happens during routine prenatal care visits? Your health care provider will: Measure your weight and blood pressure. Check for fetal heart sounds. Measure the height of your uterus in your abdomen (fundal height). This may be measured starting around week 20 of pregnancy. Check the position of your baby inside your uterus. Ask questions about your diet, sleeping patterns, and whether you can feel the baby move. Review warning signs to watch for and signs of labor. Ask about any pregnancy symptoms you are having and how you are dealing with them. Symptoms may include: Headaches. Nausea and vomiting. Vaginal discharge. Swelling. Fatigue. Constipation. Changes in your vision. Feeling persistently sad or anxious. Any discomfort, including back or pelvic pain. Bleeding or spotting. Make a list of questions to ask your health care  provider at your routine visits. What tests might I have during prenatal care visits? You may have blood, urine, and imaging tests throughout your pregnancy, such as: Urine tests to check for glucose, protein, or signs of infection. Glucose tests to check for a form of diabetes that can develop during pregnancy (gestational diabetes mellitus). This is usually done around week 24 of pregnancy. Ultrasounds to check your baby's growth and development, to check for birth defects, and to check your baby's well-being. These can also help to decide when you should deliver your baby. A test to check for group B strep (GBS) infection. This is usually done around week 36 of pregnancy. Genetic testing. This may include blood, fluid, or tissue sampling, or imaging tests, such as an ultrasound. Some genetic tests are done during the first trimester and some are done during the second trimester. What else can I expect during prenatal care visits? Your health care provider may  recommend getting certain vaccines during pregnancy. These may include: A yearly flu shot (annual influenza vaccine). This is especially important if you will be pregnant during flu season. Tdap (tetanus, diphtheria, pertussis) vaccine. Getting this vaccine during pregnancy can protect your baby from whooping cough (pertussis) after birth. This vaccine may be recommended between weeks 27 and 36 of pregnancy. A COVID-19 vaccine. Later in your pregnancy, your health care provider may give you information about: Childbirth and breastfeeding classes. Choosing a health care provider for your baby. Umbilical cord banking. Breastfeeding. Birth control after your baby is born. The hospital labor and delivery unit and how to set up a tour. Registering at the hospital before you go into labor. Where to find more information Office on Women's Health: LegalWarrants.gl American Pregnancy Association: americanpregnancy.org March of Dimes: marchofdimes.org Summary Prenatal care helps you and your baby stay as healthy as possible during pregnancy. Your first prenatal care visit will most likely be the longest. You will have visits and tests throughout your pregnancy to monitor your health and your baby's health. Bring a list of questions to your visits to ask your health care provider. Make sure to keep all follow-up and prenatal care visits. This information is not intended to replace advice given to you by your health care provider. Make sure you discuss any questions you have with your health care provider. Document Revised: 07/16/2020 Document Reviewed: 07/16/2020 Elsevier Patient Education  2022 [employer]. How a Baby Grows During Pregnancy Pregnancy begins when a female's sperm enters a female's egg. This is called fertilization. Fertilization usually happens in one of the fallopian tubes that connect the ovaries to the uterus. The fertilized egg moves down the fallopian tube to the uterus. Once it  reaches the uterus, it implants into the lining of the uterus and begins to grow. For the first 8 weeks, the fertilized egg is called an embryo. After 8 weeks, it is called a fetus. As the fetus continues to grow, it receives oxygen and nutrients through the placenta, which is an organ that grows to support the developing baby. The placenta is the life support system for the baby. It provides oxygen and nutrition and removes waste. How long does a typical pregnancy last? A pregnancy usually lasts 280 days, or about 40 weeks. Pregnancy is divided into three periods of growth, also called trimesters: First trimester: 0-12 weeks. Second trimester: 13-27 weeks. Third trimester: 28-40 weeks. The day when your baby is ready to be born (full term) is your estimated date of delivery. However, most babies  are not born on their estimated date of delivery. How does my baby develop month by month? First month The fertilized egg attaches to the inside of the uterus. Some cells will form the placenta. Others will form the fetus. The arms, legs, brain, spinal cord, lungs, and heart begin to develop. At the end of the first month, the heart begins to beat. Second month The bones, inner ear, eyelids, hands, and feet form. The genitals develop. By the end of 8 weeks, all major organs are developing. Third month All of the internal organs are forming. Teeth develop below the gums. Bones and muscles begin to grow. The spine can flex. The skin is transparent. Fingernails and toenails begin to form. Arms and legs continue to grow longer, and hands and feet develop. The fetus is about 3 inches (7.6 cm) long. Fourth month The placenta is completely formed. The external sex organs, neck, outer ear, eyebrows, eyelids, and fingernails are formed. The fetus can hear, swallow, and move its arms and legs. The kidneys begin to produce urine. The skin is covered with a white, waxy coating (vernix) and very fine hair  (lanugo). Fifth month The fetus moves around more and can be felt for the first time (quickening). The fetus starts to sleep and wake up and may begin to suck a finger. The nails grow to the end of the fingers. The organ in the digestive system that makes bile (gallbladder) functions and helps to digest nutrients. If the fetus is a female, eggs are present in the ovaries. If the fetus is a female, testicles start to move down into the scrotum. Sixth month The lungs are formed. The eyes open. The brain continues to develop. Your baby has fingerprints and toe prints. Your baby's hair grows thicker. At the end of the second trimester, the fetus is about 9 inches (22.9 cm) long. Seventh month The fetus kicks and stretches. The eyes are developed enough to sense changes in light. The hands can make a grasping motion. The fetus responds to sound. Eighth month Most organs and body systems are fully developed and functioning. Bones harden, and taste buds develop. The fetus may hiccup. Certain areas of the brain are still developing. The skull remains soft. Ninth month The fetus gains about  lb (0.23 kg) each week. The lungs are fully developed. Patterns of sleep develop. The fetus's head typically moves into a head-down position (vertex) in the uterus to prepare for birth. The fetus weighs 6-9 lb (2.72-4.08 kg) and is 19-20 inches (48.26-50.8 cm) long. How do I know if my baby is developing well? Always talk with your health care provider about any concerns that you may have about your pregnancy and your baby. At each prenatal visit, your health care provider will do several different tests to check on your health and keep track of your baby's development. These include: Fundal height and position. To do this, your health care provider will: Measure your growing belly from your pubic bone to the top of the uterus using a tape measure. Feel your belly to determine your baby's  position. Heartbeat. An ultrasound in the first trimester can confirm pregnancy and show a heartbeat, depending on how far along you are. Your health care provider will check your baby's heart rate at every prenatal visit. You will also have a second trimester ultrasound to check your baby's development. Follow these instructions at home: Take prenatal vitamins as told by your health care provider. These include vitamins such as  folic acid, iron, calcium, and vitamin D. They are important for healthy development. Take over-the-counter and prescription medicines only as told by your health care provider. Keep all follow-up visits. This is important. Follow-up visits include prenatal care and screening tests. Summary A pregnancy usually lasts 280 days, or about 40 weeks. Pregnancy is divided into three periods of growth, also called trimesters. Your health care provider will monitor your baby's growth and development throughout your pregnancy. Follow your health care provider's recommendations about taking prenatal vitamins and medicines during your pregnancy. Talk with your health care provider if you have any concerns about your pregnancy or your developing baby. This information is not intended to replace advice given to you by your health care provider. Make sure you discuss any questions you have with your health care provider. Document Revised: 03/11/2020 Document Reviewed: 01/16/2020 Elsevier Patient Education  2022 [place]. Commonly Asked Questions During Pregnancy  Cats: A parasite can be excreted in cat feces.  To avoid exposure you need to have another person empty the little box.  If you must empty the litter box you will need to wear gloves.  Wash your hands after handling your cat.  This parasite can also be found in raw or undercooked meat so this should also be avoided.  Colds, Sore Throats, Flu: Please check your medication sheet to see what you can take for symptoms.  If your  symptoms are unrelieved by these medications please call the office.  Dental Work: Most any dental work Investment banker, corporate recommends is permitted.  X-rays should only be taken during the first trimester if absolutely necessary.  Your abdomen should be shielded with a lead apron during all x-rays.  Please notify your provider prior to receiving any x-rays.  Novocaine is fine; gas is not recommended.  If your dentist requires a note from [place] prior to dental work please call the office and we will provide one for you.  Exercise: Exercise is an important part of staying healthy during your pregnancy.  You may continue most exercises you were accustomed to prior to pregnancy.  Later in your pregnancy you will most likely notice you have difficulty with activities requiring balance like riding a bicycle.  It is important that you listen to your body and avoid activities that put you at a higher risk of falling.  Adequate rest and staying well hydrated are a must!  If you have questions about the safety of specific activities ask your provider.    Exposure to Children with illness: Try to avoid obvious exposure; report any symptoms to [place] when noted,  If you have chicken pos, red measles or mumps, you should be immune to these diseases.   Please do not take any vaccines while pregnant unless you have checked with your OB provider.  Fetal Movement: After 28 weeks we recommend you do "kick counts" twice daily.  Lie or sit down in a calm quiet environment and count your baby movements "kicks".  You should feel your baby at least 10 times per hour.  If you have not felt 10 kicks within the first hour get up, walk around and have something sweet to eat or drink then repeat for an additional hour.  If count remains less than 10 per hour notify your provider.  Fumigating: Follow your pest control agent's advice as to how long to stay out of your home.  Ventilate the area well before re-entering.  Hemorrhoids:   Most  over-the-counter preparations can be  used during pregnancy.  Check your medication to see what is safe to use.  It is important to use a stool softener or fiber in your diet and to drink lots of liquids.  If hemorrhoids seem to be getting worse please call the office.   Hot Tubs:  Hot tubs Jacuzzis and saunas are not recommended while pregnant.  These increase your internal body temperature and should be avoided.  Intercourse:  Sexual intercourse is safe during pregnancy as long as you are comfortable, unless otherwise advised by your provider.  Spotting may occur after intercourse; report any bright red bleeding that is heavier than spotting.  Labor:  If you know that you are in labor, please go to the hospital.  If you are unsure, please call the office and let us help you decide what to do.  Lifting, straining, etc:  If your job requires heavy lifting or straining please check with your provider for any limitations.  Generally, you should not lift items heavier than that you can lift simply with your hands and arms (no back muscles)  Painting:  Paint fumes do not harm your pregnancy, but may make you ill and should be avoided if possible.  Latex or water based paints have less odor than oils.  Use adequate ventilation while painting.  Permanents & Hair Color:  Chemicals in hair dyes are not recommended as they cause increase hair dryness which can increase hair loss during pregnancy.  " Highlighting" and permanents are allowed.  Dye may be absorbed differently and permanents may not hold as well during pregnancy.  Sunbathing:  Use a sunscreen, as skin burns easily during pregnancy.  Drink plenty of fluids; avoid over heating.  Tanning Beds:  Because their possible side effects are still unknown, tanning beds are not recommended.  Ultrasound Scans:  Routine ultrasounds are performed at approximately 20 weeks.  You will be able to see your baby's general anatomy an if you would like to know the  gender this can usually be determined as well.  If it is questionable when you conceived you may also receive an ultrasound early in your pregnancy for dating purposes.  Otherwise ultrasound exams are not routinely performed unless there is a medical necessity.  Although you can request a scan we ask that you pay for it when conducted because insurance does not cover " patient request" scans.  Work: If your pregnancy proceeds without complications you may work until your due date, unless your physician or employer advises otherwise.  Round Ligament Pain/Pelvic Discomfort:  Sharp, shooting pains not associated with bleeding are fairly common, usually occurring in the second trimester of pregnancy.  They tend to be worse when standing up or when you remain standing for long periods of time.  These are the result of pressure of certain pelvic ligaments called "round ligaments".  Rest, Tylenol and heat seem to be the most effective relief.  As the womb and fetus grow, they rise out of the pelvis and the discomfort improves.  Please notify the office if your pain seems different than that described.  It may represent a more serious condition.  Common Medications Safe in Pregnancy  Acne:      Constipation:  Benzoyl Peroxide     Colace  Clindamycin      Dulcolax Suppository  Topica Erythromycin     Fibercon  Salicylic Acid      Metamucil         Miralax AVOID:  Senakot   Accutane    Cough:  Retin-A       Cough Drops  Tetracycline      Phenergan w/ Codeine if Rx  Minocycline      Robitussin (Plain & DM)  Antibiotics:     Crabs/Lice:  Ceclor       RID  Cephalosporins    AVOID:  E-Mycins      Kwell  Keflex  Macrobid/Macrodantin   Diarrhea:  Penicillin      Kao-Pectate  Zithromax      Imodium AD         PUSH FLUIDS AVOID:       Cipro     Fever:  Tetracycline      Tylenol (Regular or Extra  Minocycline       Strength)  Levaquin      Extra Strength-Do not          Exceed 8 tabs/24  hrs Caffeine:        <212m/day (equiv. To 1 cup of coffee or  approx. 3 12 oz sodas)         Gas: Cold/Hayfever:       Gas-X  Benadryl      Mylicon  Claritin       Phazyme  **Claritin-D        Chlor-Trimeton    Headaches:  Dimetapp      ASA-Free Excedrin  Drixoral-Non-Drowsy     Cold Compress  Mucinex (Guaifenasin)     Tylenol (Regular or Extra  Sudafed/Sudafed-12 Hour     Strength)  **Sudafed PE Pseudoephedrine   Tylenol Cold & Sinus     Vicks Vapor Rub  Zyrtec  **AVOID if Problems With Blood Pressure         Heartburn: Avoid lying down for at least 1 hour after meals  Aciphex      Maalox     Rash:  Milk of Magnesia     Benadryl    Mylanta       1% Hydrocortisone Cream  Pepcid  Pepcid Complete   Sleep Aids:  Prevacid      Ambien   Prilosec       Benadryl  Rolaids       Chamomile Tea  Tums (Limit 4/day)     Unisom         Tylenol PM         Warm milk-add vanilla or  Hemorrhoids:       Sugar for taste  Anusol/Anusol H.C.  (RX: Analapram 2.5%)  Sugar Substitutes:  Hydrocortisone OTC     Ok in moderation  Preparation H      Tucks        Vaseline lotion applied to tissue with wiping    Herpes:     Throat:  Acyclovir      Oragel  Famvir  Valtrex     Vaccines:         Flu Shot Leg Cramps:       *Gardasil  Benadryl      Hepatitis A         Hepatitis B Nasal Spray:       Pneumovax  Saline Nasal Spray     Polio Booster         Tetanus Nausea:       Tuberculosis test or PPD  Vitamin B6 25 mg TID   AVOID:    Dramamine      *Gardasil  Emetrol  Live Poliovirus  Ginger Root 250 mg QID    MMR (measles, mumps &  High Complex Carbs @ Bedtime    rebella)  Sea Bands-Accupressure    Varicella (Chickenpox)  Unisom 1/2 tab TID     *No known complications           If received before Pain:         Known pregnancy;   Darvocet       Resume series after  Lortab        Delivery  Percocet    Yeast:   Tramadol      Femstat  Tylenol  3      Gyne-lotrimin  Ultram       Monistat  Vicodin           MISC:         All Sunscreens           Hair Coloring/highlights          Insect Repellant's          (Including DEET)         Mystic Tans

## 2021-07-08 NOTE — Progress Notes (Signed)
      Lucila Maine presents for NOB nurse intake visit. Pregnancy confirmation done at [facility], 03/25/2021, with Lara Mulch Lawhorn,CNM  G 3.  P 1011.  LMP 04/17/2021.  EDD.  Ga [redacted]w[redacted]d. Pregnancy education material explained and given. 0 cats in the home.  NOB labs ordered. BMI greater than 30. TSH/HbgA1c ordered. Sickle cell noted order due to race. HIV and drug screen explained and ordered/declined. Genetic screening discussed. Genetic testing; will discuss during NOB with Provider. Pt to discuss genetic testing with provider. PNV encouraged. Pt to follow up with provider in 1 weeks for NOB physical.  FMLA, HIV/Drug Screening and [place] reviewed and signed by patient.

## 2021-07-09 ENCOUNTER — Other Ambulatory Visit: Payer: Self-pay

## 2021-07-09 DIAGNOSIS — Z3481 Encounter for supervision of other normal pregnancy, first trimester: Secondary | ICD-10-CM

## 2021-07-09 LAB — VIRAL HEPATITIS HBV, HCV
HCV Ab: <0.1 s/co ratio (ref 0.0–0.9)
Hep B Core Total Ab: NEGATIVE
Hep B Surface Ab, Qual: REACTIVE
Hepatitis B Surface Ag: NEGATIVE

## 2021-07-09 LAB — RUBELLA SCREEN: Rubella Antibodies, IGG: 1.23 index (ref >0.99)

## 2021-07-09 LAB — URINALYSIS, ROUTINE W REFLEX MICROSCOPIC
Bilirubin, UA: NEGATIVE
Glucose, UA: NEGATIVE
Ketones, UA: NEGATIVE
Leukocytes,UA: NEGATIVE
Nitrite, UA: NEGATIVE
Protein,UA: NEGATIVE
RBC, UA: NEGATIVE
Specific Gravity, UA: 1.008 (ref 1.005–1.030)
Urobilinogen, Ur: 0.2 mg/dL (ref 0.2–1.0)
pH, UA: 6.5 (ref 5.0–7.5)

## 2021-07-09 LAB — NICOTINE SCREEN, URINE: Cotinine Ql Scrn, Ur: NEGATIVE ng/mL

## 2021-07-09 LAB — DRUG PROFILE, UR, 9 DRUGS (LABCORP)
Amphetamines, Urine: NEGATIVE ng/mL
Barbiturate Quant, Ur: NEGATIVE ng/mL
Benzodiazepine Quant, Ur: NEGATIVE ng/mL
Cannabinoid Quant, Ur: NEGATIVE ng/mL
Cocaine (Metab.): NEGATIVE ng/mL
Methadone Screen, Urine: NEGATIVE ng/mL
Opiate Quant, Ur: NEGATIVE ng/mL
PCP Quant, Ur: NEGATIVE ng/mL
Propoxyphene: NEGATIVE ng/mL

## 2021-07-09 LAB — VARICELLA ZOSTER ANTIBODY, IGG: Varicella zoster IgG: <135 index — ABNORMAL LOW (ref >165)

## 2021-07-09 LAB — HCV INTERPRETATION

## 2021-07-09 LAB — ABO AND RH: Rh Factor: POSITIVE

## 2021-07-09 LAB — ANTIBODY SCREEN: Antibody Screen: NEGATIVE

## 2021-07-09 LAB — HIV ANTIBODY (ROUTINE TESTING W REFLEX): HIV Screen 4th Generation wRfx: NONREACTIVE

## 2021-07-09 LAB — HGB SOLU + RFLX FRAC: Sickle Solubility Test - HGBRFX: NEGATIVE

## 2021-07-09 LAB — TSH: TSH: 0.689 u[IU]/mL (ref 0.450–4.500)

## 2021-07-09 LAB — RPR: RPR Ser Ql: NONREACTIVE

## 2021-07-11 LAB — CULTURE, OB URINE

## 2021-07-11 LAB — URINE CULTURE, OB REFLEX

## 2021-07-12 LAB — GC/CHLAMYDIA PROBE AMP
Chlamydia trachomatis, NAA: NEGATIVE
Neisseria Gonorrhoeae by PCR: NEGATIVE

## 2021-07-15 NOTE — Patient Instructions (Addendum)
WHAT OB PATIENTS CAN EXPECT  Confirmation of pregnancy and ultrasound ordered if medically indicated-[redacted] weeks gestation New OB (NOB) intake with nurse and New OB (NOB) labs- [redacted] weeks gestation New OB (NOB) physical examination with provider- 11/[redacted] weeks gestation Flu vaccine-[redacted] weeks gestation Anatomy scan-[redacted] weeks gestation Glucose tolerance test, blood work to test for anemia, T-dap vaccine-[redacted] weeks gestation Vaginal swabs/cultures-STD/Group B strep-[redacted] weeks gestation Appointments every 4 weeks until 28 weeks Every 2 weeks from 28 weeks until 36 weeks Weekly visits from 36 weeks until delivery  Common Medications Safe in Pregnancy  Acne:      Constipation:  Benzoyl Peroxide     Colace  Clindamycin      Dulcolax Suppository  Topica Erythromycin     Fibercon  Salicylic Acid      Metamucil         Miralax AVOID:        Senakot   Accutane    Cough:  Retin-A       Cough Drops  Tetracycline      Phenergan w/ Codeine if Rx  Minocycline      Robitussin (Plain & DM)  Antibiotics:     Crabs/Lice:  Ceclor       RID  Cephalosporins    AVOID:  E-Mycins      Kwell  Keflex  Macrobid/Macrodantin   Diarrhea:  Penicillin      Kao-Pectate  Zithromax      Imodium AD         PUSH FLUIDS AVOID:       Cipro     Fever:  Tetracycline      Tylenol (Regular or Extra  Minocycline       Strength)  Levaquin      Extra Strength-Do not          Exceed 8 tabs/24 hrs Caffeine:        <200mg/day (equiv. To 1 cup of coffee or  approx. 3 12 oz sodas)         Gas: Cold/Hayfever:       Gas-X  Benadryl      Mylicon  Claritin       Phazyme  **Claritin-D        Chlor-Trimeton    Headaches:  Dimetapp      ASA-Free Excedrin  Drixoral-Non-Drowsy     Cold Compress  Mucinex (Guaifenasin)     Tylenol (Regular or Extra  Sudafed/Sudafed-12 Hour     Strength)  **Sudafed PE Pseudoephedrine   Tylenol Cold & Sinus     Vicks Vapor Rub  Zyrtec  **AVOID if Problems With Blood Pressure         Heartburn:  Avoid lying down for at least 1 hour after meals  Aciphex      Maalox     Rash:  Milk of Magnesia     Benadryl    Mylanta       1% Hydrocortisone Cream  Pepcid  Pepcid Complete   Sleep Aids:  Prevacid      Ambien   Prilosec       Benadryl  Rolaids       Chamomile Tea  Tums (Limit 4/day)     Unisom         Tylenol PM         Warm milk-add vanilla or  Hemorrhoids:       Sugar for taste  Anusol/Anusol H.C.  (RX: Analapram 2.5%)  Sugar Substitutes:  Hydrocortisone OTC       Ok in moderation  Preparation H      Tucks        Vaseline lotion applied to tissue with wiping    Herpes:     Throat:  Acyclovir      Oragel  Famvir  Valtrex     Vaccines:         Flu Shot Leg Cramps:       *Gardasil  Benadryl      Hepatitis A         Hepatitis B Nasal Spray:       Pneumovax  Saline Nasal Spray     Polio Booster         Tetanus Nausea:       Tuberculosis test or PPD  Vitamin B6 25 mg TID   AVOID:    Dramamine      *Gardasil  Emetrol       Live Poliovirus  Ginger Root 250 mg QID    MMR (measles, mumps &  High Complex Carbs @ Bedtime    rebella)  Sea Bands-Accupressure    Varicella (Chickenpox)  Unisom 1/2 tab TID     *No known complications           If received before Pain:         Known pregnancy;   Darvocet       Resume series after  Lortab        Delivery  Percocet    Yeast:   Tramadol      Femstat  Tylenol 3      Gyne-lotrimin  Ultram       Monistat  Vicodin           MISC:         All Sunscreens           Hair Coloring/highlights          Insect Repellant's          (Including DEET)         Mystic Tans  

## 2021-07-16 ENCOUNTER — Encounter: Payer: Self-pay | Admitting: Obstetrics and Gynecology

## 2021-07-16 ENCOUNTER — Ambulatory Visit (INDEPENDENT_AMBULATORY_CARE_PROVIDER_SITE_OTHER): Payer: BC Managed Care – PPO

## 2021-07-16 ENCOUNTER — Other Ambulatory Visit: Payer: Self-pay

## 2021-07-16 ENCOUNTER — Ambulatory Visit (INDEPENDENT_AMBULATORY_CARE_PROVIDER_SITE_OTHER): Payer: BC Managed Care – PPO | Admitting: Obstetrics and Gynecology

## 2021-07-16 VITALS — BP 103/68 | HR 101 | Ht 61.0 in | Wt 176.9 lb

## 2021-07-16 DIAGNOSIS — E538 Deficiency of other specified B group vitamins: Secondary | ICD-10-CM | POA: Diagnosis not present

## 2021-07-16 DIAGNOSIS — Z8759 Personal history of other complications of pregnancy, childbirth and the puerperium: Secondary | ICD-10-CM

## 2021-07-16 DIAGNOSIS — Z3481 Encounter for supervision of other normal pregnancy, first trimester: Secondary | ICD-10-CM

## 2021-07-16 DIAGNOSIS — Z3A09 9 weeks gestation of pregnancy: Secondary | ICD-10-CM

## 2021-07-16 DIAGNOSIS — E559 Vitamin D deficiency, unspecified: Secondary | ICD-10-CM | POA: Diagnosis not present

## 2021-07-16 DIAGNOSIS — R638 Other symptoms and signs concerning food and fluid intake: Secondary | ICD-10-CM

## 2021-07-16 DIAGNOSIS — Z3480 Encounter for supervision of other normal pregnancy, unspecified trimester: Secondary | ICD-10-CM | POA: Diagnosis not present

## 2021-07-16 DIAGNOSIS — Z2839 Other underimmunization status: Secondary | ICD-10-CM

## 2021-07-16 NOTE — Progress Notes (Signed)
OBSTETRIC INITIAL PRENATAL VISIT  Subjective:    Darlene Moreno is being seen today for her first obstetrical visit.  This is a planned pregnancy. She is a 35 y.o. G41P1011 female at [redacted]w[redacted]d gestation, Estimated Date of Delivery: 02/03/2022 with Patient's last menstrual period was 05/14/2021 (exact date)., dated with today's sono. Her obstetrical history is significant for  history of miscarriage .  She is currently taking progesterone supplementation. Relationship with FOB: significant other, living together. Patient does intend to breast feed. Pregnancy history fully reviewed.    OB History  Gravida Para Term Preterm AB Living  3 1 1  0 1 1  SAB IAB Ectopic Multiple Live Births  0 0 0 0 1    # Outcome Date GA Lbr Len/2nd Weight Sex Delivery Anes PTL Lv  3 Current           2 AB 12/05/20 [redacted]w[redacted]d         1 Term 03/02/10   7 lb 2 oz (3.232 kg) M Vag-Spont EPI N LIV    Gynecologic History:  Last pap smear was 12/03/2020.  Results were Normal. Notes h/o abnormal pap smear in the past (ASCUS, neg HR HPV).   Denies history of STIs.  Contraception prior to conception: None   Past Medical History:  Diagnosis Date   Miscarriage    Wears contact lenses     Family History  Problem Relation Age of Onset   Hypertension Mother    Diabetes Father    Cancer Maternal Grandmother    Breast cancer Neg Hx    Ovarian cancer Neg Hx    Colon cancer Neg Hx     Past Surgical History:  Procedure Laterality Date   EXCISION MASS NECK N/A 10/07/2020   Procedure: EXCISION  NECK EPIDERMAL INCLUSION CYST;  Surgeon: Carloyn Manner, MD;  Location: [facility];  Service: ENT;  Laterality: N/A;  Local   NO PAST SURGERIES      Social History   Socioeconomic History   Marital status: Single    Spouse name: Not on file   Number of children: Not on file   Years of education: Not on file   Highest education level: Not on file  Occupational History   Not on file  Tobacco Use   Smoking  status: Never   Smokeless tobacco: Never  Vaping Use   Vaping Use: Never used  Substance and Sexual Activity   Alcohol use: Not Currently   Drug use: Never   Sexual activity: Yes    Birth control/protection: None  Other Topics Concern   Not on file  Social History Narrative   Not on file   Social Determinants of Health   Financial Resource Strain: Not on file  Food Insecurity: Not on file  Transportation Needs: Not on file  Physical Activity: Not on file  Stress: Not on file  Social Connections: Not on file  Intimate Partner Violence: Not on file    Current Outpatient Medications on File Prior to Visit  Medication Sig Dispense Refill   Cyanocobalamin (VITAMIN B 12) 500 MCG TABS Take 1 tablet by mouth daily. 30 tablet 0   Prenatal Vit-Fe Fumarate-FA (MULTIVITAMIN-PRENATAL) 27-0.8 MG TABS tablet Take 1 tablet by mouth daily at 12 noon. 30 tablet 0   progesterone (PROMETRIUM) 200 MG capsule Place 1 capsule (200 mg total) vaginally in the morning and at bedtime. If unable to insert vaginally, can take orally. 60 capsule 3   Vitamin D, Ergocalciferol, (DRISDOL)  1.25 MG (50000 UNIT) CAPS capsule Take 1 capsule (50,000 Units total) by mouth every 7 (seven) days. 12 capsule 0   No current facility-administered medications on file prior to visit.    Allergies  Allergen Reactions   Amoxicillin Rash     Review of Systems General: Not Present- Fever, Weight Loss and Weight Gain. Skin: Not Present- Rash. HEENT: Not Present- Blurred Vision, Headache and Bleeding Gums. Respiratory: Not Present- Difficulty Breathing. Breast: Not Present- Breast Mass. Cardiovascular: Not Present- Chest Pain, Elevated Blood Pressure, Fainting / Blacking Out and Shortness of Breath. Gastrointestinal: Not Present- Abdominal Pain, Constipation, Nausea and Vomiting. Female Genitourinary: Not Present- Frequency, Painful Urination, Pelvic Pain, Vaginal Bleeding, Vaginal Discharge, Contractions, regular,  Fetal Movements Decreased, Urinary Complaints and Vaginal Fluid. Musculoskeletal: Not Present- Back Pain and Leg Cramps. Neurological: Not Present- Dizziness. Psychiatric: Not Present- Depression.     Objective:   BP 103/68   Pulse (!) 101   Wt 176 lb 14.4 oz (80.2 kg)   LMP 05/14/2021 (Exact Date)   BMI 33.42 kg/m   General Appearance:    Alert, cooperative, no distress, appears stated age  Head:    Normocephalic, without obvious abnormality, atraumatic  Eyes:    PERRL, conjunctiva/corneas clear, EOM's intact, both eyes  Ears:    Normal external ear canals, both ears  Nose:   Nares normal, septum midline, mucosa normal, no drainage or sinus tenderness  Throat:   Lips, mucosa, and tongue normal; teeth and gums normal  Neck:   Supple, symmetrical, trachea midline, no adenopathy; thyroid: no enlargement/tenderness/nodules; no carotid bruit or JVD  Back:     Symmetric, no curvature, ROM normal, no CVA tenderness  Lungs:     Clear to auscultation bilaterally, respirations unlabored  Chest Wall:    No tenderness or deformity   Heart:    Regular rate and rhythm, S1 and S2 normal, no murmur, rub or gallop  Breast Exam:    No tenderness, masses, or nipple abnormality  Abdomen:     Soft, non-tender, bowel sounds active all four quadrants, no masses, no organomegaly.  FHT 169 bpm.  Genitalia:    Pelvic:exam nontender.   Rectal:    Normal external sphincter.  No hemorrhoids appreciated. Internal exam not done.   Extremities:   Extremities normal, atraumatic, no cyanosis or edema  Pulses:   2+ and symmetric all extremities  Skin:   Skin color, texture, turgor normal, no rashes or lesions  Lymph nodes:   Cervical, supraclavicular, and axillary nodes normal  Neurologic:   CNII-XII intact, normal strength, sensation and reflexes throughout     Assessment:   1. Supervision of other normal pregnancy, antepartum   2. History of miscarriage   3. Increased BMI   4. [redacted] weeks gestation of  pregnancy   5. Vitamin D deficiency   6. Vitamin B 12 deficiency   7. Susceptible to varicella (non-immune), currently pregnant     Plan:   Supervision of normal  pregnancy  - Initial labs reviewed. Varicella non-immune. Recommend vaccination postpartum.  - Prenatal vitamins encouraged. - Problem list reviewed and updated. - New OB counseling:  The patient has been given an overview regarding routine prenatal care.   - Prenatal testing, optional genetic testing, and ultrasound use in pregnancy were reviewed.  Traditional genetic screening vs cell-fee DNA genetic screening discussed, including risks and benefits. Testing requested, Panorama ordered. - Benefits of Breast Feeding were discussed. The patient is encouraged to consider nursing her baby post partum.  2. History of miscarriage - Patient currently taking progesterone for history of miscarriage.  Has questions regarding continuing progesterone with injections in second trimester.  Discussed that it is typically used for h/o preterm loss, or PPROM. Also discussed most recent study noting no decrease in risk preterm labor with use of 17-OHP injections. Advised that further discussion could be had next visit.   3. Patient with h/o Vitamin deficiencies (B12 and Vitamin D) - Currently taking supplementation. Wonders if she needs to continue high dosing as she has recently completed course, prescribed by PCP.  Will recheck levels  4. Increased BMI - Recommendations regarding diet, weight gain, and exercise in pregnancy were given.   Follow up in 4 weeks.    Rubie Maid, MD [facility]

## 2021-07-17 ENCOUNTER — Encounter: Payer: Self-pay | Admitting: Obstetrics and Gynecology

## 2021-07-17 DIAGNOSIS — E538 Deficiency of other specified B group vitamins: Secondary | ICD-10-CM | POA: Insufficient documentation

## 2021-07-17 DIAGNOSIS — E559 Vitamin D deficiency, unspecified: Secondary | ICD-10-CM | POA: Insufficient documentation

## 2021-07-17 DIAGNOSIS — R638 Other symptoms and signs concerning food and fluid intake: Secondary | ICD-10-CM | POA: Insufficient documentation

## 2021-07-17 DIAGNOSIS — Z8639 Personal history of other endocrine, nutritional and metabolic disease: Secondary | ICD-10-CM | POA: Insufficient documentation

## 2021-07-17 LAB — CBC
Hematocrit: 42.1 % (ref 34.0–46.6)
Hemoglobin: 13.5 g/dL (ref 11.1–15.9)
MCH: 25.5 pg — ABNORMAL LOW (ref 26.6–33.0)
MCHC: 32.1 g/dL (ref 31.5–35.7)
MCV: 79 fL (ref 79–97)
Platelets: 276 10*3/uL (ref 150–450)
RBC: 5.3 x10E6/uL — ABNORMAL HIGH (ref 3.77–5.28)
RDW: 14.1 % (ref 11.7–15.4)
WBC: 6.6 10*3/uL (ref 3.4–10.8)

## 2021-07-17 LAB — VITAMIN B12: Vitamin B-12: 662 pg/mL (ref 232–1245)

## 2021-07-17 LAB — VITAMIN D 25 HYDROXY (VIT D DEFICIENCY, FRACTURES): Vit D, 25-Hydroxy: 43.8 ng/mL (ref 30.0–100.0)

## 2021-07-22 ENCOUNTER — Ambulatory Visit: Payer: BC Managed Care – PPO | Admitting: Adult Health

## 2021-07-30 DIAGNOSIS — Z3A11 11 weeks gestation of pregnancy: Secondary | ICD-10-CM | POA: Diagnosis not present

## 2021-07-30 DIAGNOSIS — O3680X0 Pregnancy with inconclusive fetal viability, not applicable or unspecified: Secondary | ICD-10-CM | POA: Diagnosis not present

## 2021-08-06 ENCOUNTER — Other Ambulatory Visit: Payer: Self-pay

## 2021-08-06 ENCOUNTER — Encounter: Payer: Self-pay | Admitting: Obstetrics and Gynecology

## 2021-08-06 ENCOUNTER — Ambulatory Visit (INDEPENDENT_AMBULATORY_CARE_PROVIDER_SITE_OTHER): Payer: BC Managed Care – PPO | Admitting: Obstetrics and Gynecology

## 2021-08-06 VITALS — BP 105/70 | HR 123 | Wt 177.9 lb

## 2021-08-06 DIAGNOSIS — B3731 Acute candidiasis of vulva and vagina: Secondary | ICD-10-CM | POA: Diagnosis not present

## 2021-08-06 DIAGNOSIS — N898 Other specified noninflammatory disorders of vagina: Secondary | ICD-10-CM | POA: Diagnosis not present

## 2021-08-06 DIAGNOSIS — T753XXA Motion sickness, initial encounter: Secondary | ICD-10-CM | POA: Diagnosis not present

## 2021-08-06 MED ORDER — SCOPOLAMINE 1 MG/3DAYS TD PT72: 1.0000 | MEDICATED_PATCH | TRANSDERMAL | 0 refills | Status: DC

## 2021-08-06 NOTE — Progress Notes (Signed)
Patient complains of thick white vaginal discharge "like yeast".  She states it is decreasing but she still would like to be checked.  Patient is using progesterone vaginally.  Have advised her she may take it for a week if needed.  She has plans to continue till 16 weeks. WET PREP: clue cells: absent, KOH (yeast): positive, odor: absent and trichomoniasis: negative Ph:  < 4.5 Advised use of Monistat. Scopolamine patch given for motion sickness-[place]. aFP next visit

## 2021-08-06 NOTE — Progress Notes (Signed)
OB pt 12w present due to having vaginal discharge. Pt reported noticing brown blood, yellow think yeast looking discharge no odor or itching about a 2 weeks ago.

## 2021-08-12 ENCOUNTER — Encounter: Payer: BC Managed Care – PPO | Admitting: Obstetrics and Gynecology

## 2021-08-24 DIAGNOSIS — Z3480 Encounter for supervision of other normal pregnancy, unspecified trimester: Secondary | ICD-10-CM | POA: Diagnosis not present

## 2021-08-24 DIAGNOSIS — Z30011 Encounter for initial prescription of contraceptive pills: Secondary | ICD-10-CM | POA: Insufficient documentation

## 2021-08-24 DIAGNOSIS — Z13220 Encounter for screening for lipoid disorders: Secondary | ICD-10-CM | POA: Insufficient documentation

## 2021-09-03 ENCOUNTER — Encounter: Payer: BC Managed Care – PPO | Admitting: Obstetrics and Gynecology

## 2021-09-24 DIAGNOSIS — Z3A19 19 weeks gestation of pregnancy: Secondary | ICD-10-CM | POA: Diagnosis not present

## 2021-09-24 DIAGNOSIS — D219 Benign neoplasm of connective and other soft tissue, unspecified: Secondary | ICD-10-CM | POA: Insufficient documentation

## 2021-09-24 DIAGNOSIS — Z3689 Encounter for other specified antenatal screening: Secondary | ICD-10-CM | POA: Diagnosis not present

## 2021-09-24 DIAGNOSIS — D259 Leiomyoma of uterus, unspecified: Secondary | ICD-10-CM | POA: Diagnosis not present

## 2021-09-24 DIAGNOSIS — O3412 Maternal care for benign tumor of corpus uteri, second trimester: Secondary | ICD-10-CM | POA: Diagnosis not present

## 2021-10-15 ENCOUNTER — Telehealth: Payer: Self-pay

## 2021-10-15 NOTE — Telephone Encounter (Signed)
Lm to call office to schedule New Patient appt with [facility].

## 2021-11-16 DIAGNOSIS — Z1329 Encounter for screening for other suspected endocrine disorder: Secondary | ICD-10-CM | POA: Diagnosis not present

## 2021-11-16 DIAGNOSIS — E559 Vitamin D deficiency, unspecified: Secondary | ICD-10-CM | POA: Diagnosis not present

## 2021-11-16 DIAGNOSIS — Z3480 Encounter for supervision of other normal pregnancy, unspecified trimester: Secondary | ICD-10-CM | POA: Diagnosis not present

## 2021-11-16 DIAGNOSIS — Z114 Encounter for screening for human immunodeficiency virus [HIV]: Secondary | ICD-10-CM | POA: Diagnosis not present

## 2021-11-16 DIAGNOSIS — E538 Deficiency of other specified B group vitamins: Secondary | ICD-10-CM | POA: Diagnosis not present

## 2021-11-16 DIAGNOSIS — Z1389 Encounter for screening for other disorder: Secondary | ICD-10-CM | POA: Diagnosis not present

## 2021-11-16 DIAGNOSIS — Z113 Encounter for screening for infections with a predominantly sexual mode of transmission: Secondary | ICD-10-CM | POA: Diagnosis not present

## 2021-11-18 DIAGNOSIS — Z8639 Personal history of other endocrine, nutritional and metabolic disease: Secondary | ICD-10-CM | POA: Insufficient documentation

## 2021-11-24 DIAGNOSIS — R7309 Other abnormal glucose: Secondary | ICD-10-CM | POA: Diagnosis not present

## 2021-12-14 DIAGNOSIS — Z2839 Other underimmunization status: Secondary | ICD-10-CM | POA: Diagnosis not present

## 2021-12-14 DIAGNOSIS — O09899 Supervision of other high risk pregnancies, unspecified trimester: Secondary | ICD-10-CM | POA: Diagnosis not present

## 2021-12-14 DIAGNOSIS — Z23 Encounter for immunization: Secondary | ICD-10-CM | POA: Diagnosis not present

## 2021-12-14 DIAGNOSIS — Z3A30 30 weeks gestation of pregnancy: Secondary | ICD-10-CM | POA: Diagnosis not present

## 2021-12-14 DIAGNOSIS — Z0374 Encounter for suspected problem with fetal growth ruled out: Secondary | ICD-10-CM | POA: Diagnosis not present

## 2021-12-14 DIAGNOSIS — O24419 Gestational diabetes mellitus in pregnancy, unspecified control: Secondary | ICD-10-CM | POA: Diagnosis not present

## 2021-12-14 DIAGNOSIS — O26843 Uterine size-date discrepancy, third trimester: Secondary | ICD-10-CM | POA: Diagnosis not present

## 2021-12-14 DIAGNOSIS — Z8759 Personal history of other complications of pregnancy, childbirth and the puerperium: Secondary | ICD-10-CM | POA: Insufficient documentation

## 2021-12-14 DIAGNOSIS — O163 Unspecified maternal hypertension, third trimester: Secondary | ICD-10-CM | POA: Diagnosis not present

## 2021-12-16 ENCOUNTER — Encounter: Payer: BC Managed Care – PPO | Admitting: Certified Nurse Midwife

## 2021-12-21 DIAGNOSIS — O163 Unspecified maternal hypertension, third trimester: Secondary | ICD-10-CM | POA: Diagnosis not present

## 2021-12-21 DIAGNOSIS — Z3A31 31 weeks gestation of pregnancy: Secondary | ICD-10-CM | POA: Diagnosis not present

## 2021-12-21 DIAGNOSIS — O133 Gestational [pregnancy-induced] hypertension without significant proteinuria, third trimester: Secondary | ICD-10-CM | POA: Diagnosis not present

## 2021-12-27 DIAGNOSIS — O163 Unspecified maternal hypertension, third trimester: Secondary | ICD-10-CM | POA: Diagnosis not present

## 2021-12-27 DIAGNOSIS — O133 Gestational [pregnancy-induced] hypertension without significant proteinuria, third trimester: Secondary | ICD-10-CM | POA: Diagnosis not present

## 2021-12-30 DIAGNOSIS — O133 Gestational [pregnancy-induced] hypertension without significant proteinuria, third trimester: Secondary | ICD-10-CM | POA: Diagnosis not present

## 2021-12-30 DIAGNOSIS — Z3A32 32 weeks gestation of pregnancy: Secondary | ICD-10-CM | POA: Diagnosis not present

## 2021-12-30 DIAGNOSIS — O24419 Gestational diabetes mellitus in pregnancy, unspecified control: Secondary | ICD-10-CM | POA: Diagnosis not present

## 2022-01-04 DIAGNOSIS — O163 Unspecified maternal hypertension, third trimester: Secondary | ICD-10-CM | POA: Diagnosis not present

## 2022-01-04 DIAGNOSIS — O133 Gestational [pregnancy-induced] hypertension without significant proteinuria, third trimester: Secondary | ICD-10-CM | POA: Diagnosis not present

## 2022-01-06 DIAGNOSIS — O99213 Obesity complicating pregnancy, third trimester: Secondary | ICD-10-CM | POA: Diagnosis not present

## 2022-01-06 DIAGNOSIS — E669 Obesity, unspecified: Secondary | ICD-10-CM | POA: Diagnosis not present

## 2022-01-06 DIAGNOSIS — O24419 Gestational diabetes mellitus in pregnancy, unspecified control: Secondary | ICD-10-CM | POA: Diagnosis not present

## 2022-01-06 DIAGNOSIS — O133 Gestational [pregnancy-induced] hypertension without significant proteinuria, third trimester: Secondary | ICD-10-CM | POA: Diagnosis not present

## 2022-01-11 DIAGNOSIS — O133 Gestational [pregnancy-induced] hypertension without significant proteinuria, third trimester: Secondary | ICD-10-CM | POA: Diagnosis not present

## 2022-01-11 DIAGNOSIS — O24410 Gestational diabetes mellitus in pregnancy, diet controlled: Secondary | ICD-10-CM | POA: Diagnosis not present

## 2022-01-12 DIAGNOSIS — Z362 Encounter for other antenatal screening follow-up: Secondary | ICD-10-CM | POA: Diagnosis not present

## 2022-01-12 DIAGNOSIS — O24419 Gestational diabetes mellitus in pregnancy, unspecified control: Secondary | ICD-10-CM | POA: Diagnosis not present

## 2022-01-12 DIAGNOSIS — O133 Gestational [pregnancy-induced] hypertension without significant proteinuria, third trimester: Secondary | ICD-10-CM | POA: Diagnosis not present

## 2022-01-12 DIAGNOSIS — O99213 Obesity complicating pregnancy, third trimester: Secondary | ICD-10-CM | POA: Diagnosis not present

## 2022-01-17 DIAGNOSIS — O479 False labor, unspecified: Secondary | ICD-10-CM | POA: Diagnosis not present

## 2022-01-17 DIAGNOSIS — O3413 Maternal care for benign tumor of corpus uteri, third trimester: Secondary | ICD-10-CM | POA: Diagnosis not present

## 2022-01-17 DIAGNOSIS — Z9189 Other specified personal risk factors, not elsewhere classified: Secondary | ICD-10-CM | POA: Diagnosis not present

## 2022-01-17 DIAGNOSIS — O368230 Fetal anemia and thrombocytopenia, third trimester, not applicable or unspecified: Secondary | ICD-10-CM | POA: Diagnosis not present

## 2022-01-17 DIAGNOSIS — O99214 Obesity complicating childbirth: Secondary | ICD-10-CM | POA: Diagnosis not present

## 2022-01-17 DIAGNOSIS — Z20822 Contact with and (suspected) exposure to covid-19: Secondary | ICD-10-CM | POA: Diagnosis not present

## 2022-01-17 DIAGNOSIS — Z051 Observation and evaluation of newborn for suspected infectious condition ruled out: Secondary | ICD-10-CM | POA: Diagnosis not present

## 2022-01-17 DIAGNOSIS — O114 Pre-existing hypertension with pre-eclampsia, complicating childbirth: Secondary | ICD-10-CM | POA: Diagnosis not present

## 2022-01-17 DIAGNOSIS — O24410 Gestational diabetes mellitus in pregnancy, diet controlled: Secondary | ICD-10-CM | POA: Diagnosis not present

## 2022-01-17 DIAGNOSIS — F32A Depression, unspecified: Secondary | ICD-10-CM | POA: Diagnosis not present

## 2022-01-17 DIAGNOSIS — Z3A35 35 weeks gestation of pregnancy: Secondary | ICD-10-CM | POA: Diagnosis not present

## 2022-01-17 DIAGNOSIS — D259 Leiomyoma of uterus, unspecified: Secondary | ICD-10-CM | POA: Diagnosis not present

## 2022-01-17 DIAGNOSIS — Z880 Allergy status to penicillin: Secondary | ICD-10-CM | POA: Insufficient documentation

## 2022-01-17 DIAGNOSIS — O24420 Gestational diabetes mellitus in childbirth, diet controlled: Secondary | ICD-10-CM | POA: Diagnosis not present

## 2022-01-17 DIAGNOSIS — O99344 Other mental disorders complicating childbirth: Secondary | ICD-10-CM | POA: Diagnosis not present

## 2022-01-17 DIAGNOSIS — E538 Deficiency of other specified B group vitamins: Secondary | ICD-10-CM | POA: Diagnosis not present

## 2022-01-17 DIAGNOSIS — F419 Anxiety disorder, unspecified: Secondary | ICD-10-CM | POA: Diagnosis not present

## 2022-01-17 DIAGNOSIS — Z23 Encounter for immunization: Secondary | ICD-10-CM | POA: Diagnosis not present

## 2022-01-17 DIAGNOSIS — O1413 Severe pre-eclampsia, third trimester: Secondary | ICD-10-CM | POA: Diagnosis not present

## 2022-01-17 DIAGNOSIS — O9912 Other diseases of the blood and blood-forming organs and certain disorders involving the immune mechanism complicating childbirth: Secondary | ICD-10-CM | POA: Diagnosis not present

## 2022-01-17 DIAGNOSIS — O4703 False labor before 37 completed weeks of gestation, third trimester: Secondary | ICD-10-CM | POA: Diagnosis not present

## 2022-01-17 DIAGNOSIS — O43893 Other placental disorders, third trimester: Secondary | ICD-10-CM | POA: Diagnosis not present

## 2022-01-17 DIAGNOSIS — D696 Thrombocytopenia, unspecified: Secondary | ICD-10-CM | POA: Diagnosis not present

## 2022-01-17 DIAGNOSIS — O43813 Placental infarction, third trimester: Secondary | ICD-10-CM | POA: Diagnosis not present

## 2022-01-27 DIAGNOSIS — Z1331 Encounter for screening for depression: Secondary | ICD-10-CM | POA: Diagnosis not present

## 2022-02-14 DIAGNOSIS — M5489 Other dorsalgia: Secondary | ICD-10-CM | POA: Diagnosis not present

## 2022-06-10 ENCOUNTER — Telehealth: Payer: BC Managed Care – PPO | Admitting: Physician Assistant

## 2022-06-10 DIAGNOSIS — R3989 Other symptoms and signs involving the genitourinary system: Secondary | ICD-10-CM

## 2022-06-10 MED ORDER — CEPHALEXIN 500 MG PO CAPS: 500.0000 mg | ORAL_CAPSULE | Freq: Two times a day (BID) | ORAL | 0 refills | Status: DC

## 2022-06-10 NOTE — Progress Notes (Signed)

## 2022-06-13 ENCOUNTER — Ambulatory Visit: Payer: BC Managed Care – PPO

## 2022-08-12 ENCOUNTER — Telehealth: Payer: BC Managed Care – PPO | Admitting: Family

## 2022-08-12 ENCOUNTER — Ambulatory Visit
Admission: RE | Admit: 2022-08-12 | Discharge: 2022-08-12 | Disposition: A | Discharge status: Home / Self Care | Payer: BC Managed Care – PPO | Source: Ambulatory Visit

## 2022-08-12 VITALS — BP 124/85 | HR 101 | Temp 98.1°F | Resp 16

## 2022-08-12 DIAGNOSIS — B9689 Other specified bacterial agents as the cause of diseases classified elsewhere: Secondary | ICD-10-CM

## 2022-08-12 DIAGNOSIS — H1013 Acute atopic conjunctivitis, bilateral: Secondary | ICD-10-CM

## 2022-08-12 DIAGNOSIS — J019 Acute sinusitis, unspecified: Secondary | ICD-10-CM | POA: Diagnosis not present

## 2022-08-12 MED ORDER — CEFDINIR 300 MG PO CAPS: 300.0000 mg | ORAL_CAPSULE | Freq: Two times a day (BID) | ORAL | 0 refills | Status: AC

## 2022-08-12 NOTE — ED Provider Notes (Signed)
Darlene Moreno    CSN: [id number] Arrival date & time: 08/12/22  1308      History   Chief Complaint Chief Complaint  Patient presents with   Nasal Congestion    I think i have a sinus infection - Entered by patient    HPI Darlene Moreno is a 36 y.o. female.   HPI  Presents to UC with complaint of symptoms x2 weeks.  Symptoms include sinus pressure, nasal congestion, cough.  Denies fever, chills, myalgias.  Past Medical History:  Diagnosis Date   Miscarriage    Wears contact lenses     Patient Active Problem List   Diagnosis Date Noted   Vitamin B 12 deficiency 07/17/2021   Vitamin D deficiency 07/17/2021   Increased BMI 07/17/2021   Numbness and tingling of left arm and leg 04/24/2021   ASCUS of cervix with negative high risk HPV 12/14/2020   History of spontaneous abortion 12/14/2020   Supervision of other normal pregnancy, antepartum 11/21/2020   Type O blood, Rh positive 11/21/2020   Susceptible to varicella (non-immune), currently pregnant 11/21/2020   E. coli UTI 11/09/2020    Past Surgical History:  Procedure Laterality Date   EXCISION MASS NECK N/A 10/07/2020   Procedure: EXCISION  NECK EPIDERMAL INCLUSION CYST;  Surgeon: Carloyn Manner, MD;  Location: [facility];  Service: ENT;  Laterality: N/A;  Local   NO PAST SURGERIES      OB History     Gravida  3   Para  1   Term  1   Preterm      AB  1   Living  1      SAB      IAB      Ectopic      Multiple      Live Births  1            Home Medications    Prior to Admission medications   Medication Sig Start Date End Date Taking? Authorizing Provider  cephALEXin (KEFLEX) 500 MG capsule Take 1 capsule (500 mg total) by mouth 2 (two) times daily. 06/10/22   Mar Daring, PA-C  Cholecalciferol (VITAMIN D-3) 25 MCG (1000 UT) CAPS Take by mouth.    [provider]  Cyanocobalamin (VITAMIN B12 PO) Take by mouth.    [provider]   Prenatal Vit-Fe Fumarate-FA (MULTIVITAMIN-PRENATAL) 27-0.8 MG TABS tablet Take 1 tablet by mouth daily at 12 noon. 04/25/21   Val Riles, MD  progesterone (PROMETRIUM) 200 MG capsule Place 1 capsule (200 mg total) vaginally in the morning and at bedtime. If unable to insert vaginally, can take orally. 06/24/21   Rubie Maid, MD  scopolamine (TRANSDERM-SCOP, 1.5 MG,) 1 MG/3DAYS Place 1 patch (1.5 mg total) onto the skin every 3 (three) days. 08/06/21   Harlin Heys, MD    Family History Family History  Problem Relation Age of Onset   Hypertension Mother    Diabetes Father    Cancer Maternal Grandmother    Breast cancer Neg Hx    Ovarian cancer Neg Hx    Colon cancer Neg Hx     Social History Social History   Tobacco Use   Smoking status: Never   Smokeless tobacco: Never  Vaping Use   Vaping Use: Never used  Substance Use Topics   Alcohol use: Not Currently   Drug use: Never     Allergies   Amoxicillin   Review of Systems Review of  Systems   Physical Exam Triage Vital Signs ED Triage Vitals  Enc Vitals Group     BP      Pulse      Resp      Temp      Temp src      SpO2      Weight      Height      Head Circumference      Peak Flow      Pain Score      Pain Loc      Pain Edu?      Excl. in [place]?    No data found.  Updated Vital Signs There were no vitals taken for this visit.  Visual Acuity Right Eye Distance:   Left Eye Distance:   Bilateral Distance:    Right Eye Near:   Left Eye Near:    Bilateral Near:     Physical Exam Vitals reviewed.  Constitutional:      Appearance: Normal appearance.  HENT:     Nose:     Right Sinus: Maxillary sinus tenderness present.     Left Sinus: Maxillary sinus tenderness present.     Mouth/Throat:     Pharynx: No oropharyngeal exudate or posterior oropharyngeal erythema.  Eyes:     Conjunctiva/sclera: Conjunctivae normal.     Pupils: Pupils are equal, round, and reactive to light.   Cardiovascular:     Rate and Rhythm: Regular rhythm. Tachycardia present.     Pulses: Normal pulses.  Pulmonary:     Effort: Pulmonary effort is normal.     Breath sounds: Normal breath sounds.  Skin:    General: Skin is warm and dry.  Neurological:     General: No focal deficit present.     Mental Status: She is alert and oriented to person, place, and time.  Psychiatric:        Mood and Affect: Mood normal.        Behavior: Behavior normal.      UC Treatments / Results  Labs (all labs ordered are listed, but only abnormal results are displayed) Labs Reviewed - No data to display  EKG   Radiology No results found.  Procedures Procedures (including critical care time)  Medications Ordered in UC Medications - No data to display  Initial Impression / Assessment and Plan / UC Course  I have reviewed the triage vital signs and the nursing notes.  Pertinent labs & imaging results that were available during my care of the patient were reviewed by me and considered in my medical decision making (see chart for details).   Maxillary tenderness with palpation.  Lungs CTAB.  Symptoms x2 weeks so will treat for bacterial sinusitis.  Patient with amoxicillin allergy but previously tolerating cephalexin.  Will prescribe cefdinir.  Described potential allergic reaction with patient and recommended she stop medication and take a dose of Benadryl if she develops a reaction.   Final Clinical Impressions(s) / UC Diagnoses   Final diagnoses:  None   Discharge Instructions   None    ED Prescriptions   None    PDMP not reviewed this encounter.   Rose Phi, [place] 08/12/22 1325

## 2022-08-12 NOTE — Discharge Instructions (Addendum)
Follow up here or with your primary care provider if your symptoms are worsening or not improving with treatment.     

## 2022-08-12 NOTE — ED Triage Notes (Signed)
Pt. Presents to UC w/ c/o sinus pressure, nasal congestion and a cough for the past 2 weeks. Pt. Expresses concern for sinus infection.

## 2022-08-13 ENCOUNTER — Ambulatory Visit
Admission: RE | Admit: 2022-08-13 | Discharge: 2022-08-13 | Disposition: A | Discharge status: Home / Self Care | Payer: BC Managed Care – PPO | Source: Ambulatory Visit

## 2022-08-13 VITALS — BP 109/77 | HR 105 | Temp 98.1°F | Resp 18

## 2022-08-13 DIAGNOSIS — B9689 Other specified bacterial agents as the cause of diseases classified elsewhere: Secondary | ICD-10-CM | POA: Diagnosis not present

## 2022-08-13 DIAGNOSIS — H109 Unspecified conjunctivitis: Secondary | ICD-10-CM

## 2022-08-13 MED ORDER — OFLOXACIN 0.3 % OP SOLN: OPHTHALMIC | 0 refills | Status: AC

## 2022-08-13 MED ORDER — CETIRIZINE HCL 10 MG PO TABS: 10.0000 mg | ORAL_TABLET | Freq: Every day | ORAL | 1 refills | Status: DC

## 2022-08-13 NOTE — ED Provider Notes (Signed)
Darlene Moreno    CSN: [id number] Arrival date & time: 08/13/22  1231      History   Chief Complaint Chief Complaint  Patient presents with   Eye Problem    I think i may have pink eye - Entered by patient   Eye Drainage    HPI [name] is a 36 y.o. female.   Patient presents with bilateral eye swelling, irritation, drainage that started yesterday.  Patient reports that she has been having some crustiness and purulent drainage coming from bilateral eyes with associated upper eyelid swelling.  Denies trauma or foreign body to the eyes.  Patient does wear contacts but has not been wearing them since symptoms started.  Denies any vision changes that are different from baseline.  Patient was seen yesterday in urgent care and treated for upper respiratory infection with cefdinir antibiotic as well.  She had an E-visit after urgent care visit for eye symptoms and was prescribed cetirizine given suspicion of allergic conjunctivitis.  She states that she has not picked this up from pharmacy yet.   Eye Problem   Past Medical History:  Diagnosis Date   Miscarriage    Wears contact lenses     Patient Active Problem List   Diagnosis Date Noted   History of penicillin allergy 01/17/2022   History of severe pre-eclampsia 12/14/2021   History of diet-controlled diabetes 11/18/2021   Uterine fibroid 09/24/2021   Encounter for oral contraception initial prescription 08/24/2021   Postpartum care and examination 08/24/2021   Vitamin B 12 deficiency 07/17/2021   Vitamin D deficiency 07/17/2021   Increased BMI 07/17/2021   History of vitamin D deficiency 07/17/2021   Numbness and tingling of left arm and leg 04/24/2021   ASCUS of cervix with negative high risk HPV 12/14/2020   History of spontaneous abortion 12/14/2020   Supervision of other normal pregnancy, antepartum 11/21/2020   Type O blood, Rh positive 11/21/2020   Susceptible to varicella (non-immune), currently  pregnant 11/21/2020   E. coli UTI 11/09/2020    Past Surgical History:  Procedure Laterality Date   EXCISION MASS NECK N/A 10/07/2020   Procedure: EXCISION  NECK EPIDERMAL INCLUSION CYST;  Surgeon: Carloyn Manner, MD;  Location: [facility];  Service: ENT;  Laterality: N/A;  Local   NO PAST SURGERIES      OB History     Gravida  3   Para  1   Term  1   Preterm      AB  1   Living  1      SAB      IAB      Ectopic      Multiple      Live Births  1            Home Medications    Prior to Admission medications   Medication Sig Start Date End Date Taking? Authorizing Provider  norethindrone (MICRONOR) 0.35 MG tablet Take 1 tablet by mouth daily. 03/02/22 03/02/23 Yes [provider]  ofloxacin (OCUFLOX) 0.3 % ophthalmic solution Place 1 drop into both eyes every 4 (four) hours for 2 days, THEN 1 drop 4 (four) times daily for 5 days. 08/13/22 08/20/22 Yes Dyllan Kats, Michele Rockers, FNP  acetaminophen (TYLENOL) 325 MG tablet Take by mouth. 01/20/22   [provider]  cefdinir (OMNICEF) 300 MG capsule Take 1 capsule (300 mg total) by mouth 2 (two) times daily for 5 days. 08/12/22 08/17/22  Immordino, Annie Main, FNP  cetirizine (ZYRTEC ALLERGY) 10 MG tablet Take 1 tablet (10 mg total) by mouth daily. 08/13/22   Evelina Dun A, FNP  Cholecalciferol (VITAMIN D-3) 25 MCG (1000 UT) CAPS Take by mouth.    [provider]  Cyanocobalamin (VITAMIN B12 PO) Take by mouth.    [provider]  Ferrous Sulfate (IRON PO) Take 1 tablet by mouth daily.    [provider]  ibuprofen (ADVIL) 600 MG tablet Take by mouth. 01/20/22   [provider]  norethindrone (MICRONOR) 0.35 MG tablet Take 1 tablet by mouth daily. 05/29/22   [provider]  Prenatal Vit-Fe Fumarate-FA (MULTIVITAMIN-PRENATAL) 27-0.8 MG TABS tablet Take 1 tablet by mouth daily at 12 noon. 04/25/21   Val Riles, MD  progesterone (PROMETRIUM) 200 MG capsule Place  1 capsule (200 mg total) vaginally in the morning and at bedtime. If unable to insert vaginally, can take orally. 06/24/21   Rubie Maid, MD  scopolamine (TRANSDERM-SCOP, 1.5 MG,) 1 MG/3DAYS Place 1 patch (1.5 mg total) onto the skin every 3 (three) days. 08/06/21   Harlin Heys, MD    Family History Family History  Problem Relation Age of Onset   Hypertension Mother    Diabetes Father    Cancer Maternal Grandmother    Breast cancer Neg Hx    Ovarian cancer Neg Hx    Colon cancer Neg Hx     Social History Social History   Tobacco Use   Smoking status: Never   Smokeless tobacco: Never  Vaping Use   Vaping Use: Never used  Substance Use Topics   Alcohol use: Not Currently   Drug use: Never     Allergies   Amoxicillin   Review of Systems Review of Systems Per HPI  Physical Exam Triage Vital Signs ED Triage Vitals [08/13/22 1251]  Enc Vitals Group     BP 109/77     Pulse Rate (!) 105     Resp 18     Temp 98.1 F (36.7 C)     Temp src      SpO2 98 %     Weight      Height      Head Circumference      Peak Flow      Pain Score 0     Pain Loc      Pain Edu?      Excl. in [place]?    No data found.  Updated Vital Signs BP 109/77   Pulse (!) 105   Temp 98.1 F (36.7 C)   Resp 18   LMP 08/06/2022 (Exact Date)   SpO2 98%   Visual Acuity Right Eye Distance:   Left Eye Distance:   Bilateral Distance:    Right Eye Near:   Left Eye Near:    Bilateral Near:     Physical Exam Constitutional:      General: She is not in acute distress.    Appearance: Normal appearance. She is not toxic-appearing or diaphoretic.  HENT:     Head: Normocephalic and atraumatic.  Eyes:     General: Lids are everted, no foreign bodies appreciated. Vision grossly intact. Gaze aligned appropriately.     Extraocular Movements: Extraocular movements intact.     Conjunctiva/sclera:     Right eye: Right conjunctiva is injected. Chemosis present. No exudate.    Left eye: Left  conjunctiva is injected. Chemosis and exudate present.     Pupils: Pupils are equal, round, and reactive to light.  Comments: Patient has moderate bilateral upper eyelid swelling with no discoloration.  Pulmonary:     Effort: Pulmonary effort is normal.  Neurological:     General: No focal deficit present.     Mental Status: She is alert and oriented to person, place, and time. Mental status is at baseline.  Psychiatric:        Mood and Affect: Mood normal.        Behavior: Behavior normal.        Thought Content: Thought content normal.        Judgment: Judgment normal.      UC Treatments / Results  Labs (all labs ordered are listed, but only abnormal results are displayed) Labs Reviewed - No data to display  EKG   Radiology No results found.  Procedures Procedures (including critical care time)  Medications Ordered in UC Medications - No data to display  Initial Impression / Assessment and Plan / UC Course  I have reviewed the triage vital signs and the nursing notes.  Pertinent labs & imaging results that were available during my care of the patient were reviewed by me and considered in my medical decision making (see chart for details).     Differential diagnoses include allergic conjunctivitis versus bacterial conjunctivitis.  Advised patient to start taking cetirizine prescribed by other healthcare provider yesterday as I do think this will be helpful given upper eyelid swelling.  There is no concern for orbital or preseptal cellulitis noted on exam.  I am concerned for bacterial component given that she has some purulent drainage noted.  Therefore, will treat with ofloxacin given that she is a contact lens wear.  Visual acuity appears normal.  Patient was advised to follow-up with ophthalmology if symptoms persist or worsens and reports that she had already has an established eye doctor.  Patient verbalized understanding and was agreeable with plan. Final Clinical  Impressions(s) / UC Diagnoses   Final diagnoses:  Bacterial conjunctivitis of both eyes     Discharge Instructions      You have an infection of your eyes.  I am treating this with a topical antibiotic.  Please also pick up Zyrtec that was prescribed yesterday and take this as well.  Follow-up if symptoms persist or worsen.    ED Prescriptions     Medication Sig Dispense Auth. Provider   ofloxacin (OCUFLOX) 0.3 % ophthalmic solution Place 1 drop into both eyes every 4 (four) hours for 2 days, THEN 1 drop 4 (four) times daily for 5 days. 1.6 mL Teodora Medici, [place]      PDMP not reviewed this encounter.   Teodora Medici, [place] 08/13/22 1315

## 2022-08-13 NOTE — Progress Notes (Signed)
E visit for Allergic Rhinitis We are sorry that you are not feeling well.  Here is how we plan to help!  Based on what you have shared with me it looks like you have Allergic Rhinitis.  Rhinitis is when a reaction occurs that causes nasal congestion, runny nose, sneezing, and itching.  Most types of rhinitis are caused by an inflammation and are associated with symptoms in the eyes ears or throat. There are several types of rhinitis.  The most common are acute rhinitis, which is usually caused by a viral illness, allergic or seasonal rhinitis, and nonallergic or year-round rhinitis.  Nasal allergies occur certain times of the year.  Allergic rhinitis is caused when allergens in the air trigger the release of histamine in the body.  Histamine causes itching, swelling, and fluid to build up in the fragile linings of the nasal passages, sinuses and eyelids.  An itchy nose and clear discharge are common.  I recommend the following over the counter treatments: You should take a daily dose of antihistamine zyrtec 10 mg daily.    You may also benefit from eye drops such as: Visine 1-2 drops each eye twice daily as needed  HOME CARE:  You can use an over-the-counter saline nasal spray as needed Avoid areas where there is heavy dust, mites, or molds Stay indoors on windy days during the pollen season Keep windows closed in home, at least in bedroom; use air conditioner. Use high-efficiency house air filter Keep windows closed in car, turn AC on re-circulate Avoid playing out with dog during pollen season  GET HELP RIGHT AWAY IF:  If your symptoms do not improve within 10 days You become short of breath You develop yellow or green discharge from your nose for over 3 days You have coughing fits  MAKE SURE YOU:  Understand these instructions Will watch your condition Will get help right away if you are not doing well or get worse  Thank you for choosing an e-visit. Your e-visit answers were  reviewed by a board certified advanced clinical practitioner to complete your personal care plan. Depending upon the condition, your plan could have included both over the counter or prescription medications. Please review your pharmacy choice. Be sure that the pharmacy you have chosen is open so that you can pick up your prescription now.  If there is a problem you may message your provider in [place] to have the prescription routed to another pharmacy. Your safety is important to [place]. If you have drug allergies check your prescription carefully.  For the next 24 hours, you can use MyChart to ask questions about today's visit, request a non-urgent call back, or ask for a work or school excuse from your e-visit provider. You will get an email in the next two days asking about your experience. I hope that your e-visit has been valuable and will speed your recovery.    Approximately 5 minutes was spent documenting and reviewing patient's chart.

## 2022-08-13 NOTE — Discharge Instructions (Signed)
You have an infection of your eyes.  I am treating this with a topical antibiotic.  Please also pick up Zyrtec that was prescribed yesterday and take this as well.  Follow-up if symptoms persist or worsen.

## 2022-08-13 NOTE — ED Triage Notes (Signed)
Pt. Presents to UC w/ c/o bilateral eye drainage and redness that started yesterday.

## 2022-08-29 ENCOUNTER — Ambulatory Visit
Admission: EM | Admit: 2022-08-29 | Discharge: 2022-08-29 | Disposition: A | Discharge status: Home / Self Care | Payer: BC Managed Care – PPO | Attending: Emergency Medicine | Admitting: Emergency Medicine

## 2022-08-29 DIAGNOSIS — J029 Acute pharyngitis, unspecified: Secondary | ICD-10-CM

## 2022-08-29 DIAGNOSIS — H9202 Otalgia, left ear: Secondary | ICD-10-CM

## 2022-08-29 DIAGNOSIS — J302 Other seasonal allergic rhinitis: Secondary | ICD-10-CM | POA: Diagnosis not present

## 2022-08-29 DIAGNOSIS — R0981 Nasal congestion: Secondary | ICD-10-CM

## 2022-08-29 LAB — POCT RAPID STREP A (OFFICE): Rapid Strep A Screen: NEGATIVE

## 2022-08-29 MED ORDER — PREDNISONE 10 MG PO TABS: 40.0000 mg | ORAL_TABLET | Freq: Every day | ORAL | 0 refills | Status: AC

## 2022-08-29 MED ORDER — FLUTICASONE PROPIONATE 50 MCG/ACT NA SUSP: 1.0000 | Freq: Every day | NASAL | 0 refills | Status: DC

## 2022-08-29 NOTE — ED Provider Notes (Signed)
[name]    CSN: [id number] Arrival date & time: 08/29/22  1406      History   Chief Complaint Chief Complaint  Patient presents with   Sore Throat   Otalgia    HPI Darlene Moreno is a 36 y.o. female.  Patient presents with left ear pain and sore throat x2 to 3 days.  She also has mild nasal congestion which has continued from recent illness. Treatment attempted with ibuprofen and OTC sore throat spray.  She denies fever, rash, shortness of breath, vomiting, diarrhea, or other symptoms.  Patient was seen at this urgent care on 08/12/2022; diagnosed with acute sinusitis; treated with cefdinir.  She was seen at this urgent care again on 08/13/2022; diagnosed with conjunctivitis and treated with ofloxacin eyedrops.  The history is provided by the patient and medical records.    Past Medical History:  Diagnosis Date   Miscarriage    Wears contact lenses     Patient Active Problem List   Diagnosis Date Noted   History of penicillin allergy 01/17/2022   History of severe pre-eclampsia 12/14/2021   History of diet-controlled diabetes 11/18/2021   Uterine fibroid 09/24/2021   Encounter for oral contraception initial prescription 08/24/2021   Postpartum care and examination 08/24/2021   Vitamin B 12 deficiency 07/17/2021   Vitamin D deficiency 07/17/2021   Increased BMI 07/17/2021   History of vitamin D deficiency 07/17/2021   Numbness and tingling of left arm and leg 04/24/2021   ASCUS of cervix with negative high risk HPV 12/14/2020   History of spontaneous abortion 12/14/2020   Supervision of other normal pregnancy, antepartum 11/21/2020   Type O blood, Rh positive 11/21/2020   Susceptible to varicella (non-immune), currently pregnant 11/21/2020   E. coli UTI 11/09/2020    Past Surgical History:  Procedure Laterality Date   EXCISION MASS NECK N/A 10/07/2020   Procedure: EXCISION  NECK EPIDERMAL INCLUSION CYST;  Surgeon: Carloyn Manner, MD;  Location:  [facility];  Service: ENT;  Laterality: N/A;  Local   NO PAST SURGERIES      OB History     Gravida  3   Para  1   Term  1   Preterm      AB  1   Living  1      SAB      IAB      Ectopic      Multiple      Live Births  1            Home Medications    Prior to Admission medications   Medication Sig Start Date End Date Taking? Authorizing Provider  acetaminophen (TYLENOL) 325 MG tablet Take by mouth. 01/20/22  Yes [provider]  cetirizine (ZYRTEC ALLERGY) 10 MG tablet Take 1 tablet (10 mg total) by mouth daily. 08/13/22  Yes Hawks, Christy A, FNP  fluticasone (FLONASE) 50 MCG/ACT nasal spray Place 1 spray into both nostrils daily. 08/29/22  Yes Sharion Balloon, NP  ibuprofen (ADVIL) 600 MG tablet Take by mouth. 01/20/22  Yes [provider]  norethindrone (MICRONOR) 0.35 MG tablet Take 1 tablet by mouth daily. 05/29/22  Yes [provider]  norethindrone (MICRONOR) 0.35 MG tablet Take 1 tablet by mouth daily. 03/02/22 03/02/23 Yes [provider]  predniSONE (DELTASONE) 10 MG tablet Take 4 tablets (40 mg total) by mouth daily for 3 days. 08/29/22 09/01/22 Yes Sharion Balloon, NP  Prenatal Vit-Fe Fumarate-FA Reyne Dumas)  27-0.8 MG TABS tablet Take 1 tablet by mouth daily at 12 noon. 04/25/21  Yes Val Riles, MD  Cholecalciferol (VITAMIN D-3) 25 MCG (1000 UT) CAPS Take by mouth.    [provider]  Cyanocobalamin (VITAMIN B12 PO) Take by mouth.    [provider]  Ferrous Sulfate (IRON PO) Take 1 tablet by mouth daily.    [provider]  progesterone (PROMETRIUM) 200 MG capsule Place 1 capsule (200 mg total) vaginally in the morning and at bedtime. If unable to insert vaginally, can take orally. 06/24/21   Rubie Maid, MD  scopolamine (TRANSDERM-SCOP, 1.5 MG,) 1 MG/3DAYS Place 1 patch (1.5 mg total) onto the skin every 3 (three) days. 08/06/21   Harlin Heys, MD    Family  History Family History  Problem Relation Age of Onset   Hypertension Mother    Diabetes Father    Cancer Maternal Grandmother    Breast cancer Neg Hx    Ovarian cancer Neg Hx    Colon cancer Neg Hx     Social History Social History   Tobacco Use   Smoking status: Never   Smokeless tobacco: Never  Vaping Use   Vaping Use: Never used  Substance Use Topics   Alcohol use: Not Currently   Drug use: Never     Allergies   Amoxicillin   Review of Systems Review of Systems  Constitutional:  Negative for chills and fever.  HENT:  Positive for ear pain and sore throat.   Respiratory:  Negative for cough and shortness of breath.   Cardiovascular:  Negative for chest pain and palpitations.  Gastrointestinal:  Negative for diarrhea and vomiting.  Skin:  Negative for rash.  All other systems reviewed and are negative.    Physical Exam Triage Vital Signs ED Triage Vitals  Enc Vitals Group     BP 08/29/22 1446 129/84     Pulse Rate 08/29/22 1441 (!) 101     Resp 08/29/22 1441 18     Temp 08/29/22 1441 98.2 F (36.8 C)     Temp src --      SpO2 08/29/22 1441 97 %     Weight --      Height --      Head Circumference --      Peak Flow --      Pain Score 08/29/22 1447 5     Pain Loc --      Pain Edu? --      Excl. in [place]? --    No data found.  Updated Vital Signs BP 129/84 (BP Location: Right Arm)   Pulse (!) 101   Temp 98.2 F (36.8 C)   Resp 18   LMP 08/28/2022 (Exact Date)   SpO2 97%   Visual Acuity Right Eye Distance:   Left Eye Distance:   Bilateral Distance:    Right Eye Near:   Left Eye Near:    Bilateral Near:     Physical Exam Vitals and nursing note reviewed.  Constitutional:      General: She is not in acute distress.    Appearance: Normal appearance. She is well-developed. She is not ill-appearing.  HENT:     Right Ear: Tympanic membrane normal.     Left Ear: Tympanic membrane normal.     Nose: Nose normal.     Mouth/Throat:     Mouth:  Mucous membranes are moist.     Pharynx: Oropharynx is clear.  Cardiovascular:  Rate and Rhythm: Normal rate and regular rhythm.     Heart sounds: Normal heart sounds.  Pulmonary:     Effort: Pulmonary effort is normal. No respiratory distress.     Breath sounds: Normal breath sounds.  Musculoskeletal:     Cervical back: Neck supple.  Skin:    General: Skin is warm and dry.  Neurological:     Mental Status: She is alert.  Psychiatric:        Mood and Affect: Mood normal.        Behavior: Behavior normal.      UC Treatments / Results  Labs (all labs ordered are listed, but only abnormal results are displayed) Labs Reviewed  POCT RAPID STREP A (OFFICE)    EKG   Radiology No results found.  Procedures Procedures (including critical care time)  Medications Ordered in UC Medications - No data to display  Initial Impression / Assessment and Plan / UC Course  I have reviewed the triage vital signs and the nursing notes.  Pertinent labs & imaging results that were available during my care of the patient were reviewed by me and considered in my medical decision making (see chart for details).    Seasonal allergies, left ear pain, sore throat, nasal congestion.  Rapid strep negative.  Treating with Flonase nasal spray and 3-day course of prednisone.  Discussed symptomatic treatment including ibuprofen and Mucinex.  Instructed patient to follow up with her PCP if her symptoms are not improving.  Education provided on allergic rhinitis, earache, sore throat.  She agrees to plan of care.    Final Clinical Impressions(s) / UC Diagnoses   Final diagnoses:  Seasonal allergies  Left ear pain  Sore throat  Nasal congestion     Discharge Instructions      Take ibuprofen and Mucinex as directed.  Take the prednisone and use the Flonase nasal spray as directed.  Follow up with your primary care provider if your symptoms are not improving.        ED Prescriptions      Medication Sig Dispense Auth. Provider   predniSONE (DELTASONE) 10 MG tablet Take 4 tablets (40 mg total) by mouth daily for 3 days. 12 tablet Sharion Balloon, NP   fluticasone (FLONASE) 50 MCG/ACT nasal spray Place 1 spray into both nostrils daily. 16 g Sharion Balloon, NP      PDMP not reviewed this encounter.   Sharion Balloon, NP 08/29/22 1530

## 2022-08-29 NOTE — Discharge Instructions (Addendum)
Take ibuprofen and Mucinex as directed.  Take the prednisone and use the Flonase nasal spray as directed.  Follow up with your primary care provider if your symptoms are not improving.

## 2022-08-29 NOTE — ED Triage Notes (Signed)
Had a sinus infection last week, now is having a sore throat and ear pain on left side. Using sore throat spray OTC and mucinex but didn't relieve symptoms.

## 2022-08-30 ENCOUNTER — Ambulatory Visit: Payer: BC Managed Care – PPO

## 2024-02-03 ENCOUNTER — Ambulatory Visit

## 2024-02-09 ENCOUNTER — Ambulatory Visit
Admission: RE | Admit: 2024-02-09 | Discharge: 2024-02-09 | Disposition: A | Discharge status: Home / Self Care | Source: Ambulatory Visit | Attending: Emergency Medicine | Admitting: Emergency Medicine

## 2024-02-09 VITALS — BP 120/87 | HR 98 | Temp 99.2°F | Resp 16

## 2024-02-09 DIAGNOSIS — R350 Frequency of micturition: Secondary | ICD-10-CM

## 2024-02-09 LAB — POCT URINALYSIS DIP (MANUAL ENTRY)
Bilirubin, UA: NEGATIVE
Blood, UA: NEGATIVE
Glucose, UA: NEGATIVE mg/dL
Nitrite, UA: NEGATIVE
Protein Ur, POC: NEGATIVE mg/dL
Spec Grav, UA: 1.025 (ref 1.010–1.025)
Urobilinogen, UA: 0.2 U/dL
pH, UA: 5.5 (ref 5.0–8.0)

## 2024-02-09 MED ORDER — CIPROFLOXACIN HCL 500 MG PO TABS: 500.0000 mg | ORAL_TABLET | Freq: Two times a day (BID) | ORAL | 0 refills | Status: AC

## 2024-02-09 NOTE — ED Provider Notes (Signed)
 Darlene Moreno    CSN: [id number] Arrival date & time: 02/09/24  1428      History   Chief Complaint Chief Complaint  Patient presents with   Urinary Frequency    I have a UTI - Entered by patient    HPI Darlene Moreno is a 38 y.o. female.   Patient presents for evaluation of urinary frequency, dysuria and lower back pain present for 7 days.  Completed e-visit and was prescribed Macrobid  with minimal improvement.  Denies hematuria, abdominal pain, fever or vaginal symptoms.  Last menstrual cycle 01/26/2024.  Past Medical History:  Diagnosis Date   Miscarriage    Wears contact lenses     Patient Active Problem List   Diagnosis Date Noted   History of penicillin allergy 01/17/2022   History of severe pre-eclampsia 12/14/2021   History of diet-controlled diabetes 11/18/2021   Uterine fibroid 09/24/2021   Encounter for oral contraception initial prescription 08/24/2021   Postpartum care and examination 08/24/2021   Vitamin B 12 deficiency 07/17/2021   Vitamin D  deficiency 07/17/2021   Increased BMI 07/17/2021   History of vitamin D  deficiency 07/17/2021   Numbness and tingling of left arm and leg 04/24/2021   ASCUS of cervix with negative high risk HPV 12/14/2020   History of spontaneous abortion 12/14/2020   Supervision of other normal pregnancy, antepartum 11/21/2020   Type O blood, Rh positive 11/21/2020   Susceptible to varicella (non-immune), currently pregnant 11/21/2020   E. coli UTI 11/09/2020    Past Surgical History:  Procedure Laterality Date   EXCISION MASS NECK N/A 10/07/2020   Procedure: EXCISION  NECK EPIDERMAL INCLUSION CYST;  Surgeon: Rogers Clayman, MD;  Location: [facility];  Service: ENT;  Laterality: N/A;  Local   NO PAST SURGERIES      OB History     Gravida  3   Para  1   Term  1   Preterm      AB  1   Living  1      SAB      IAB      Ectopic      Multiple      Live Births  1            Home  Medications    Prior to Admission medications   Medication Sig Start Date End Date Taking? Authorizing Provider  acetaminophen  (TYLENOL ) 325 MG tablet Take by mouth. 01/20/22  Yes [provider]  ciprofloxacin (CIPRO) 500 MG tablet Take 1 tablet (500 mg total) by mouth 2 (two) times daily for 3 days. 02/09/24 02/12/24 Yes Jilliane Kazanjian R, NP  ibuprofen (ADVIL) 600 MG tablet Take by mouth. 01/20/22  Yes [provider]  norethindrone (MICRONOR) 0.35 MG tablet Take 1 tablet by mouth daily. 05/29/22  Yes [provider]  cetirizine  (ZYRTEC  ALLERGY) 10 MG tablet Take 1 tablet (10 mg total) by mouth daily. 08/13/22   Tommas Fragmin A, FNP  Cholecalciferol (VITAMIN D -3) 25 MCG (1000 UT) CAPS Take by mouth.    [provider]  Cyanocobalamin  (VITAMIN B12 PO) Take by mouth.    [provider]  Ferrous Sulfate (IRON PO) Take 1 tablet by mouth daily.    [provider]  fluticasone  (FLONASE ) 50 MCG/ACT nasal spray Place 1 spray into both nostrils daily. 08/29/22   Wellington Half, NP  norethindrone (MICRONOR) 0.35 MG tablet Take 1 tablet by mouth daily. 03/02/22 03/02/23  [provider]  Prenatal Vit-Fe Fumarate-FA (MULTIVITAMIN-PRENATAL) 27-0.8 MG TABS tablet Take 1 tablet by mouth daily at 12 noon. 04/25/21   Althia Atlas, MD  progesterone  (PROMETRIUM ) 200 MG capsule Place 1 capsule (200 mg total) vaginally in the morning and at bedtime. If unable to insert vaginally, can take orally. 06/24/21   Teresa Fender, MD  scopolamine  (TRANSDERM-SCOP, 1.5 MG,) 1 MG/3DAYS Place 1 patch (1.5 mg total) onto the skin every 3 (three) days. 08/06/21   Zenobia Hila, MD    Family History Family History  Problem Relation Age of Onset   Hypertension Mother    Diabetes Father    Cancer Maternal Grandmother    Breast cancer Neg Hx    Ovarian cancer Neg Hx    Colon cancer Neg Hx     Social History Social History   Tobacco Use   Smoking status: Never    Smokeless tobacco: Never  Vaping Use   Vaping status: Never Used  Substance Use Topics   Alcohol use: Not Currently   Drug use: Never     Allergies   Amoxicillin   Review of Systems Review of Systems   Physical Exam Triage Vital Signs ED Triage Vitals [02/09/24 1445]  Encounter Vitals Group     BP 120/87     Systolic BP Percentile      Diastolic BP Percentile      Pulse Rate 98     Resp 16     Temp 99.2 F (37.3 C)     Temp Source Oral     SpO2 98 %     Weight      Height      Head Circumference      Peak Flow      Pain Score      Pain Loc      Pain Education      Exclude from Growth Chart    No data found.  Updated Vital Signs BP 120/87 (BP Location: Left Arm)   Pulse 98   Temp 99.2 F (37.3 C) (Oral)   Resp 16   LMP 01/26/2024 (Approximate)   SpO2 98%   Visual Acuity Right Eye Distance:   Left Eye Distance:   Bilateral Distance:    Right Eye Near:   Left Eye Near:    Bilateral Near:     Physical Exam Constitutional:      Appearance: Normal appearance.  Eyes:     Extraocular Movements: Extraocular movements intact.  Pulmonary:     Effort: Pulmonary effort is normal.  Abdominal:     Tenderness: There is no abdominal tenderness. There is no right CVA tenderness, left CVA tenderness or guarding.  Neurological:     Mental Status: She is alert and oriented to person, place, and time. Mental status is at baseline.      UC Treatments / Results  Labs (all labs ordered are listed, but only abnormal results are displayed) Labs Reviewed  POCT URINALYSIS DIP (MANUAL ENTRY) - Abnormal; Notable for the following components:      Result Value   Color, UA light yellow (*)    Ketones, POC UA trace (5) (*)    Leukocytes, UA Trace (*)    All other components within normal limits  URINE CULTURE    EKG   Radiology No results found.  Procedures Procedures (including critical care time)  Medications Ordered in UC Medications - No data to  display  Initial Impression / Assessment and Plan / UC Course  I  have reviewed the triage vital signs and the nursing notes.  Pertinent labs & imaging results that were available during my care of the patient were reviewed by me and considered in my medical decision making (see chart for details).  Urinary frequency  Urinalysis negative for nitrates, shows some leukocytes, discussed findings and sent for culture, prescribe Cipro prophylactically recommended yeast and BV testing, declined, recommended additional supportive care with follow-up as needed Final Clinical Impressions(s) / UC Diagnoses   Final diagnoses:  Urinary frequency     Discharge Instructions      Your urinalysis not show bacteria at this time, your urine will be sent to the lab to determine exactly which bacteria is present, if any changes need to be made to your medications you will be notified  Begin use of Cipro twice daily for 3 days  You may use over-the-counter pyridium to help minimize your symptoms until antibiotic removes bacteria, this medication will turn your urine orange  Increase your fluid intake through use of water  As always practice good hygiene, wiping front to back and avoidance of scented vaginal products to prevent further irritation  If symptoms continue to persist after use of medication or recur please follow-up with urgent care or your primary doctor as needed    ED Prescriptions     Medication Sig Dispense Auth. Provider   ciprofloxacin (CIPRO) 500 MG tablet Take 1 tablet (500 mg total) by mouth 2 (two) times daily for 3 days. 6 tablet Ameena Vesey R, NP      PDMP not reviewed this encounter.   Reena Canning, NP 02/09/24 1504

## 2024-02-09 NOTE — ED Triage Notes (Signed)
 Dysuria started x 1 week.  Patient reports she did and e-visit and was prescribed Macrobid .

## 2024-02-09 NOTE — Discharge Instructions (Addendum)
 Your urinalysis not show bacteria at this time, your urine will be sent to the lab to determine exactly which bacteria is present, if any changes need to be made to your medications you will be notified  Begin use of Cipro twice daily for 3 days  You may use over-the-counter pyridium to help minimize your symptoms until antibiotic removes bacteria, this medication will turn your urine orange  Increase your fluid intake through use of water  As always practice good hygiene, wiping front to back and avoidance of scented vaginal products to prevent further irritation  If symptoms continue to persist after use of medication or recur please follow-up with urgent care or your primary doctor as needed

## 2024-02-12 LAB — URINE CULTURE

## 2024-02-24 ENCOUNTER — Ambulatory Visit
Admission: RE | Admit: 2024-02-24 | Discharge: 2024-02-24 | Discharge status: Home / Self Care | Source: Ambulatory Visit | Attending: Emergency Medicine | Admitting: Emergency Medicine

## 2024-02-24 VITALS — BP 119/85 | HR 84 | Temp 98.7°F | Resp 18 | Ht 61.0 in | Wt 185.0 lb

## 2024-02-24 DIAGNOSIS — R350 Frequency of micturition: Secondary | ICD-10-CM | POA: Insufficient documentation

## 2024-02-24 LAB — POCT URINALYSIS DIP (MANUAL ENTRY)
Bilirubin, UA: NEGATIVE
Glucose, UA: NEGATIVE mg/dL
Ketones, POC UA: NEGATIVE mg/dL
Leukocytes, UA: NEGATIVE
Nitrite, UA: NEGATIVE
Spec Grav, UA: >=1.03 — AB (ref 1.010–1.025)
Urobilinogen, UA: 0.2 U/dL
pH, UA: 6 (ref 5.0–8.0)

## 2024-02-24 MED ORDER — SULFAMETHOXAZOLE-TRIMETHOPRIM 800-160 MG PO TABS: 1.0000 | ORAL_TABLET | Freq: Two times a day (BID) | ORAL | 0 refills | Status: DC

## 2024-02-24 NOTE — ED Triage Notes (Signed)
 Patient states urinary frequency, lower back back, treated for UTI overe a week ago and just not getting better.

## 2024-02-24 NOTE — Discharge Instructions (Addendum)
 your urine will be sent to the lab to determine exactly which bacteria is present, if any changes need to be made to your medications you will be notified  Begin use of Bactrim twice daily for 7 days  You may use over-the-counter Azo to help minimize your symptoms until antibiotic removes bacteria, this medication will turn your urine orange  Increase your fluid intake through use of water  As always practice good hygiene, wiping front to back and avoidance of scented vaginal products to prevent further irritation  If symptoms continue to persist after use of medication or recur please follow-up with urgent care or your primary doctor as needed

## 2024-02-24 NOTE — ED Provider Notes (Signed)
 [facility]    CSN: [id number] Arrival date & time: 02/24/24  9528      History   Chief Complaint Chief Complaint  Patient presents with   Urinary Frequency    I have been seen here recently for a UTI, and my urine culture came back inconclusive, and i thought i was feeling better but im starting to have some of the symptoms again and would like to do another urine culture. - Entered by patient    HPI CHRISTEN GADDY is a 38 y.o. female.   Patient presents for evaluation of urinary frequency present for 3 days.  Recently had similar symptoms which resolved with use of ciprofloxacin .  Endorses back pain experienced during that time did not fully resolve however unsure if related to menstruation at this time.  Denies dysuria, hematuria, abdominal pain, fever or vaginal symptoms.  Endorses urine culture showed multiple species.  Past Medical History:  Diagnosis Date   Miscarriage    Wears contact lenses     Patient Active Problem List   Diagnosis Date Noted   History of penicillin allergy 01/17/2022   History of severe pre-eclampsia 12/14/2021   History of diet-controlled diabetes 11/18/2021   Uterine fibroid 09/24/2021   Encounter for oral contraception initial prescription 08/24/2021   Postpartum care and examination 08/24/2021   Vitamin B 12 deficiency 07/17/2021   Vitamin D  deficiency 07/17/2021   Increased BMI 07/17/2021   History of vitamin D  deficiency 07/17/2021   Numbness and tingling of left arm and leg 04/24/2021   ASCUS of cervix with negative high risk HPV 12/14/2020   History of spontaneous abortion 12/14/2020   Supervision of other normal pregnancy, antepartum 11/21/2020   Type O blood, Rh positive 11/21/2020   Susceptible to varicella (non-immune), currently pregnant 11/21/2020   E. coli UTI 11/09/2020    Past Surgical History:  Procedure Laterality Date   EXCISION MASS NECK N/A 10/07/2020   Procedure: EXCISION  NECK EPIDERMAL INCLUSION CYST;   Surgeon: Rogers Clayman, MD;  Location: [facility];  Service: ENT;  Laterality: N/A;  Local   NO PAST SURGERIES      OB History     Gravida  3   Para  1   Term  1   Preterm      AB  1   Living  1      SAB      IAB      Ectopic      Multiple      Live Births  1            Home Medications    Prior to Admission medications   Medication Sig Start Date End Date Taking? Authorizing Provider  sulfamethoxazole-trimethoprim (BACTRIM DS) 800-160 MG tablet Take 1 tablet by mouth 2 (two) times daily for 7 days. 02/24/24 03/02/24 Yes Gentle Hoge R, NP  acetaminophen  (TYLENOL ) 325 MG tablet Take by mouth. 01/20/22   [provider]  cetirizine  (ZYRTEC  ALLERGY) 10 MG tablet Take 1 tablet (10 mg total) by mouth daily. 08/13/22   Tommas Fragmin A, FNP  Cholecalciferol (VITAMIN D -3) 25 MCG (1000 UT) CAPS Take by mouth.    [provider]  Cyanocobalamin  (VITAMIN B12 PO) Take by mouth.    [provider]  Ferrous Sulfate (IRON PO) Take 1 tablet by mouth daily.    [provider]  fluticasone  (FLONASE ) 50 MCG/ACT nasal spray Place 1 spray into both nostrils daily. 08/29/22   Arabella Knife,  Adonis Hoot, NP  ibuprofen (ADVIL) 600 MG tablet Take by mouth. 01/20/22   [provider]  norethindrone (MICRONOR) 0.35 MG tablet Take 1 tablet by mouth daily. 05/29/22   [provider]  norethindrone (MICRONOR) 0.35 MG tablet Take 1 tablet by mouth daily. 03/02/22 03/02/23  [provider]  Prenatal Vit-Fe Fumarate-FA (MULTIVITAMIN-PRENATAL) 27-0.8 MG TABS tablet Take 1 tablet by mouth daily at 12 noon. 04/25/21   Althia Atlas, MD  progesterone  (PROMETRIUM ) 200 MG capsule Place 1 capsule (200 mg total) vaginally in the morning and at bedtime. If unable to insert vaginally, can take orally. 06/24/21   Teresa Fender, MD  scopolamine  (TRANSDERM-SCOP, 1.5 MG,) 1 MG/3DAYS Place 1 patch (1.5 mg total) onto the skin every 3 (three) days. 08/06/21    Zenobia Hila, MD    Family History Family History  Problem Relation Age of Onset   Hypertension Mother    Diabetes Father    Cancer Maternal Grandmother    Breast cancer Neg Hx    Ovarian cancer Neg Hx    Colon cancer Neg Hx     Social History Social History   Tobacco Use   Smoking status: Never   Smokeless tobacco: Never  Vaping Use   Vaping status: Never Used  Substance Use Topics   Alcohol use: Yes    Comment: socially   Drug use: Never     Allergies   Amoxicillin   Review of Systems Review of Systems   Physical Exam Triage Vital Signs ED Triage Vitals  Encounter Vitals Group     BP 02/24/24 0957 119/85     Systolic BP Percentile --      Diastolic BP Percentile --      Pulse Rate 02/24/24 0957 84     Resp 02/24/24 0957 18     Temp 02/24/24 0957 98.7 F (37.1 C)     Temp Source 02/24/24 0957 Oral     SpO2 02/24/24 0957 98 %     Weight 02/24/24 0954 185 lb (83.9 kg)     Height 02/24/24 0954 5\' 1"  (1.549 m)     Head Circumference --      Peak Flow --      Pain Score 02/24/24 0954 2     Pain Loc --      Pain Education --      Exclude from Growth Chart --    No data found.  Updated Vital Signs BP 119/85 (BP Location: Left Arm)   Pulse 84   Temp 98.7 F (37.1 C) (Oral)   Resp 18   Ht 5\' 1"  (1.549 m)   Wt 185 lb (83.9 kg)   LMP 02/16/2024 (Approximate)   SpO2 98%   BMI 34.96 kg/m   Visual Acuity Right Eye Distance:   Left Eye Distance:   Bilateral Distance:    Right Eye Near:   Left Eye Near:    Bilateral Near:     Physical Exam Constitutional:      Appearance: Normal appearance.  Eyes:     Extraocular Movements: Extraocular movements intact.  Pulmonary:     Effort: Pulmonary effort is normal.  Abdominal:     Tenderness: There is no abdominal tenderness. There is no right CVA tenderness, left CVA tenderness or guarding.  Neurological:     Mental Status: She is alert and oriented to person, place, and time.      UC  Treatments / Results  Labs (all labs ordered are listed, but  only abnormal results are displayed) Labs Reviewed  POCT URINALYSIS DIP (MANUAL ENTRY) - Abnormal; Notable for the following components:      Result Value   Spec Grav, UA >=1.030 (*)    Blood, UA trace-intact (*)    Protein Ur, POC trace (*)    All other components within normal limits  URINE CULTURE    EKG   Radiology No results found.  Procedures Procedures (including critical care time)  Medications Ordered in UC Medications - No data to display  Initial Impression / Assessment and Plan / UC Course  I have reviewed the triage vital signs and the nursing notes.  Pertinent labs & imaging results that were available during my care of the patient were reviewed by me and considered in my medical decision making (see chart for details).  Urinary frequency  Urinalysis negative, sent for culture, initiating treatment with Bactrim as she did not have success with antibiotic however symptoms did return, denies all vaginal symptoms at this time therefore testing for yeast and BV deferred, recommended supportive care with follow-up as needed Final Clinical Impressions(s) / UC Diagnoses   Final diagnoses:  Urinary frequency   Discharge Instructions       your urine will be sent to the lab to determine exactly which bacteria is present, if any changes need to be made to your medications you will be notified  Begin use of Bactrim twice daily for 7 days  You may use over-the-counter Azo to help minimize your symptoms until antibiotic removes bacteria, this medication will turn your urine orange  Increase your fluid intake through use of water  As always practice good hygiene, wiping front to back and avoidance of scented vaginal products to prevent further irritation  If symptoms continue to persist after use of medication or recur please follow-up with urgent care or your primary doctor as needed   ED Prescriptions      Medication Sig Dispense Auth. Provider   sulfamethoxazole-trimethoprim (BACTRIM DS) 800-160 MG tablet Take 1 tablet by mouth 2 (two) times daily for 7 days. 14 tablet Uriyah Massimo R, NP      PDMP not reviewed this encounter.   Reena Canning, NP 02/24/24 1019

## 2024-02-25 LAB — URINE CULTURE: Culture: NO GROWTH

## 2024-02-27 ENCOUNTER — Encounter: Payer: Self-pay | Admitting: Emergency Medicine

## 2024-02-27 ENCOUNTER — Ambulatory Visit (INDEPENDENT_AMBULATORY_CARE_PROVIDER_SITE_OTHER)

## 2024-02-27 ENCOUNTER — Ambulatory Visit
Admission: EM | Admit: 2024-02-27 | Discharge: 2024-02-27 | Disposition: A | Discharge status: Home / Self Care | Attending: Emergency Medicine | Admitting: Emergency Medicine

## 2024-02-27 ENCOUNTER — Ambulatory Visit: Payer: Self-pay | Admitting: Emergency Medicine

## 2024-02-27 DIAGNOSIS — R102 Pelvic and perineal pain unspecified side: Secondary | ICD-10-CM

## 2024-02-27 DIAGNOSIS — R109 Unspecified abdominal pain: Secondary | ICD-10-CM | POA: Diagnosis present

## 2024-02-27 DIAGNOSIS — R3915 Urgency of urination: Secondary | ICD-10-CM | POA: Diagnosis not present

## 2024-02-27 DIAGNOSIS — R319 Hematuria, unspecified: Secondary | ICD-10-CM

## 2024-02-27 DIAGNOSIS — R350 Frequency of micturition: Secondary | ICD-10-CM | POA: Diagnosis present

## 2024-02-27 DIAGNOSIS — R10A0 Flank pain, unspecified side: Secondary | ICD-10-CM

## 2024-02-27 LAB — URINALYSIS, ROUTINE W REFLEX MICROSCOPIC
Bilirubin Urine: NEGATIVE
Glucose, UA: NEGATIVE mg/dL
Ketones, ur: NEGATIVE mg/dL
Leukocytes,Ua: NEGATIVE
Nitrite: NEGATIVE
Protein, ur: NEGATIVE mg/dL
Specific Gravity, Urine: 1.01 (ref 1.005–1.030)
pH: 6 (ref 5.0–8.0)

## 2024-02-27 LAB — URINALYSIS, MICROSCOPIC (REFLEX)

## 2024-02-27 LAB — PREGNANCY, URINE: Preg Test, Ur: NEGATIVE

## 2024-02-27 MED ORDER — NAPROXEN 500 MG PO TABS: 500.0000 mg | ORAL_TABLET | Freq: Two times a day (BID) | ORAL | 0 refills | Status: DC

## 2024-02-27 MED ORDER — PHENAZOPYRIDINE HCL 200 MG PO TABS: 200.0000 mg | ORAL_TABLET | Freq: Three times a day (TID) | ORAL | 0 refills | Status: DC | PRN

## 2024-02-27 NOTE — Discharge Instructions (Addendum)
 I did not appreciate any evidence of kidney stones on your x-ray.  Your urinalysis today showed trace blood and few bacteria.  I have sent this off for culture again to make sure that you are not having a urinary tract infection.  We have sent off a swab for gonorrhea, chlamydia, trichomonas, BV and yeast.  We will treat you based off these labs.  Refrain from intercourse until all of your labs have been resulted, your symptoms have resolved and your partner is treated if necessary.  Take the Naprosyn with 1000 mg of Tylenol  twice a day.  Pyridium will turn your urine orange, but will help with urinary urgency and frequency.  Go to the emergency department for fevers above 100.4, pain not controlled with the Naprosyn/Tylenol , gross blood in your urine, vomiting, or for other concerns.

## 2024-02-27 NOTE — ED Provider Notes (Signed)
 HPI  SUBJECTIVE:  Darlene Moreno is a 38 y.o. female who presents with 2 weeks of urinary urgency, frequency with bilateral back and flank pain.  Reports dull, intermittent, minutes long diffuse pelvic pain starting today.  No dysuria, cloudy odorous urine, hematuria, vaginal odor, bleeding, discharge, rash, itching.  She is in a long-term monogamous relationship with a female, who is asymptomatic.  However would like to be checked for STDs.  No nausea, vomiting, fevers.  She has been on Macrobid , Cipro  and is on day 3/5 of Bactrim for presumed UTI.  She states the Cipro  helped.  No aggravating factors.  She has never had symptoms like this before.  Patient had trace leukocytes on 4/25 and trace protein/RBCs on 5/10.  Urine culture from 4/25 showed multiple species present, suggested recollection.  Urine culture 5/10 negative.  She has a past medical history of E. coli UTI and vaginal yeast infections.  No history pyelonephritis, nephrolithiasis, BV, STD, PID, PCOS, endometriosis, abdominal surgeries, interstitial cystitis.  LMP: Last week.  Not sure she could be pregnant.  PCP: She is establishing care on Thursday.  Past Medical History:  Diagnosis Date   Miscarriage    Wears contact lenses     Past Surgical History:  Procedure Laterality Date   EXCISION MASS NECK N/A 10/07/2020   Procedure: EXCISION  NECK EPIDERMAL INCLUSION CYST;  Surgeon: Rogers Clayman, MD;  Location: [facility];  Service: ENT;  Laterality: N/A;  Local   NO PAST SURGERIES      Family History  Problem Relation Age of Onset   Hypertension Mother    Diabetes Father    Cancer Maternal Grandmother    Breast cancer Neg Hx    Ovarian cancer Neg Hx    Colon cancer Neg Hx     Social History   Tobacco Use   Smoking status: Never   Smokeless tobacco: Never  Vaping Use   Vaping status: Never Used  Substance Use Topics   Alcohol use: Yes    Comment: socially   Drug use: Never    No current  facility-administered medications for this encounter.  Current Outpatient Medications:    naproxen (NAPROSYN) 500 MG tablet, Take 1 tablet (500 mg total) by mouth 2 (two) times daily for 7 days., Disp: 14 tablet, Rfl: 0   phenazopyridine (PYRIDIUM) 200 MG tablet, Take 1 tablet (200 mg total) by mouth 3 (three) times daily as needed for pain., Disp: 6 tablet, Rfl: 0   acetaminophen  (TYLENOL ) 325 MG tablet, Take by mouth., Disp: , Rfl:    cetirizine  (ZYRTEC  ALLERGY) 10 MG tablet, Take 1 tablet (10 mg total) by mouth daily., Disp: 90 tablet, Rfl: 1   Cholecalciferol (VITAMIN D -3) 25 MCG (1000 UT) CAPS, Take by mouth., Disp: , Rfl:    Cyanocobalamin  (VITAMIN B12 PO), Take by mouth., Disp: , Rfl:    Ferrous Sulfate (IRON PO), Take 1 tablet by mouth daily., Disp: , Rfl:    fluticasone  (FLONASE ) 50 MCG/ACT nasal spray, Place 1 spray into both nostrils daily., Disp: 16 g, Rfl: 0   norethindrone (MICRONOR) 0.35 MG tablet, Take 1 tablet by mouth daily., Disp: , Rfl:    norethindrone (MICRONOR) 0.35 MG tablet, Take 1 tablet by mouth daily., Disp: , Rfl:    Prenatal Vit-Fe Fumarate-FA (MULTIVITAMIN-PRENATAL) 27-0.8 MG TABS tablet, Take 1 tablet by mouth daily at 12 noon., Disp: 30 tablet, Rfl: 0   progesterone  (PROMETRIUM ) 200 MG capsule, Place 1 capsule (200 mg total) vaginally in  the morning and at bedtime. If unable to insert vaginally, can take orally., Disp: 60 capsule, Rfl: 3   scopolamine  (TRANSDERM-SCOP, 1.5 MG,) 1 MG/3DAYS, Place 1 patch (1.5 mg total) onto the skin every 3 (three) days., Disp: 3 patch, Rfl: 0   sulfamethoxazole-trimethoprim (BACTRIM DS) 800-160 MG tablet, Take 1 tablet by mouth 2 (two) times daily for 7 days., Disp: 14 tablet, Rfl: 0  Allergies  Allergen Reactions   Amoxicillin Rash     ROS  As noted in HPI.   Physical Exam  BP 123/85 (BP Location: Left Arm)   Pulse (!) 115   Temp 98.2 F (36.8 C) (Oral)   Resp 18   LMP 02/16/2024 (Approximate)   SpO2 98%    Constitutional: Well developed, well nourished, no acute distress Eyes:  EOMI, conjunctiva normal bilaterally HENT: Normocephalic, atraumatic,mucus membranes moist Respiratory: Normal inspiratory effort Cardiovascular: Normal rate GI: nondistended soft, nontender. No suprapubic, lower quadrant, flank tenderness  back: No CVA tenderness.  No paralumbar tenderness. GU: External genitalia normal.  Normal vaginal mucosa.  Normal os.  Nonodorous mucoid vaginal discharge. Uterus smooth,  NT. No  CMT. No adnexal tenderness. No adnexal masses.  Chaperone present during exam skin: No rash, skin intact Musculoskeletal: no deformities Neurologic: Alert & oriented x 3, no focal neuro deficits Psychiatric: Speech and behavior appropriate   ED Course   Medications - No data to display  Orders Placed This Encounter  Procedures   Pelvic exam    Standing Status:   Standing    Number of Occurrences:   1   Urine Culture    Standing Status:   Standing    Number of Occurrences:   1    Indication:   Urgency/frequency   DG Abd 1 View    Standing Status:   Standing    Number of Occurrences:   1    Reason for Exam (SYMPTOM  OR DIAGNOSIS REQUIRED):   Bilateral flank, pelvic pain, hematuria rule out nephrolithiasis   Urinalysis, Routine w reflex microscopic -Urine, Clean Catch    Standing Status:   Standing    Number of Occurrences:   1    Specimen Source:   Urine, Clean Catch [[id number]]   Urinalysis, Microscopic (reflex)    Standing Status:   Standing    Number of Occurrences:   1   Pregnancy, urine    Standing Status:   Standing    Number of Occurrences:   1    Results for orders placed or performed during the hospital encounter of 02/27/24 (from the past 24 hours)  Urinalysis, Routine w reflex microscopic -Urine, Clean Catch     Status: Abnormal   Collection Time: 02/27/24  7:15 PM  Result Value Ref Range   Color, Urine YELLOW YELLOW   APPearance CLEAR CLEAR   Specific Gravity, Urine 1.010  1.005 - 1.030   pH 6.0 5.0 - 8.0   Glucose, UA NEGATIVE NEGATIVE mg/dL   Hgb urine dipstick TRACE (A) NEGATIVE   Bilirubin Urine NEGATIVE NEGATIVE   Ketones, ur NEGATIVE NEGATIVE mg/dL   Protein, ur NEGATIVE NEGATIVE mg/dL   Nitrite NEGATIVE NEGATIVE   Leukocytes,Ua NEGATIVE NEGATIVE  Urinalysis, Microscopic (reflex)     Status: Abnormal   Collection Time: 02/27/24  7:15 PM  Result Value Ref Range   RBC / HPF 0-5 0 - 5 RBC/hpf   WBC, UA 0-5 0 - 5 WBC/hpf   Bacteria, UA RARE (A) NONE SEEN   Squamous Epithelial / HPF  0-5 0 - 5 /HPF  Pregnancy, urine     Status: None   Collection Time: 02/27/24  7:15 PM  Result Value Ref Range   Preg Test, Ur NEGATIVE NEGATIVE   DG Abd 1 View Result Date: 02/27/2024 CLINICAL DATA:  Flank pain EXAM: ABDOMEN - 1 VIEW COMPARISON:  None Available. FINDINGS: The bowel gas pattern is normal. No radio-opaque calculi or other significant radiographic abnormality are seen. IMPRESSION: Negative. Electronically Signed   By: Fredrich Jefferson M.D.   On: 02/27/2024 20:43    ED Clinical Impression  1. Hematuria, unspecified type   2. Urinary urgency   3. Urinary frequency   4. Flank pain   5. Pelvic pain in female    ***  ED Assessment/Plan  {The patient has been seen in Urgent Care in the last 3 years. :1}  Patient presents with acute illness with systemic symptoms of tachycardia.  Previous records, labs reviewed.  As noted in HPI.  Differential includes persistent UTI, nephrolithiasis, PID, BV, STD, pelvic floor dysfunction, urethral spasm.  Doubt torsion, TOA.  UA, urine pregnancy, pelvic exam, cervical vaginal swab, KUB.  Reviewed imaging independently.  No radiopaque stones.  Will contact patient at [phone] if radiology overread differs enough from mine and we need to change management.  Reviewed radiology report.  No nephrolithiasis consistent with my read.  UA with trace hematuria, rare bacteria.  It is a clean catch.  Will send this off for  a third culture to make sure that this is not a persistent UTI.  No evidence of PID on exam.  Will further treatment off of labs.  She is establishing care with a PCP on Thursday.  Will send home with Naprosyn/Tylenol , Pyridium.  Advised pt to refrain from sexual contact until she knows lab results, symptoms resolve, and partner(s) are treated if necessary. Pt provided working phone number. Follow-up with PCP on Thursday.  Discussed labs, MDM, plan and followup with patient.   ER return precautions given. Pt agrees with plan.   Meds ordered this encounter  Medications   phenazopyridine (PYRIDIUM) 200 MG tablet    Sig: Take 1 tablet (200 mg total) by mouth 3 (three) times daily as needed for pain.    Dispense:  6 tablet    Refill:  0   naproxen (NAPROSYN) 500 MG tablet    Sig: Take 1 tablet (500 mg total) by mouth 2 (two) times daily for 7 days.    Dispense:  14 tablet    Refill:  0    *This clinic note was created using Scientist, clinical (histocompatibility and immunogenetics). Therefore, there may be occasional mistakes despite careful proofreading.  ?

## 2024-02-27 NOTE — ED Triage Notes (Signed)
 Pt presents with flank pain, pelvic pain and urinary urgency for several weeks. She was seen at [facility] and treated with cipro  on 4/25 and again on 5/10 and is on day 3 of Bactrim and doesn't feel better. Her urine culture resulted on 5/11 and it was negative.

## 2024-02-29 ENCOUNTER — Ambulatory Visit

## 2024-02-29 ENCOUNTER — Other Ambulatory Visit (HOSPITAL_COMMUNITY)
Admission: RE | Admit: 2024-02-29 | Discharge: 2024-02-29 | Disposition: A | Discharge status: Home / Self Care | Source: Ambulatory Visit

## 2024-02-29 VITALS — BP 118/84 | HR 87 | Temp 98.9°F | Ht 61.5 in | Wt 184.4 lb

## 2024-02-29 DIAGNOSIS — Z8632 Personal history of gestational diabetes: Secondary | ICD-10-CM

## 2024-02-29 DIAGNOSIS — Z13220 Encounter for screening for lipoid disorders: Secondary | ICD-10-CM

## 2024-02-29 DIAGNOSIS — D649 Anemia, unspecified: Secondary | ICD-10-CM

## 2024-02-29 DIAGNOSIS — R3915 Urgency of urination: Secondary | ICD-10-CM | POA: Diagnosis not present

## 2024-02-29 DIAGNOSIS — R5383 Other fatigue: Secondary | ICD-10-CM

## 2024-02-29 DIAGNOSIS — N898 Other specified noninflammatory disorders of vagina: Secondary | ICD-10-CM | POA: Insufficient documentation

## 2024-02-29 DIAGNOSIS — O24410 Gestational diabetes mellitus in pregnancy, diet controlled: Secondary | ICD-10-CM

## 2024-02-29 DIAGNOSIS — R748 Abnormal levels of other serum enzymes: Secondary | ICD-10-CM

## 2024-02-29 HISTORY — DX: Gestational diabetes mellitus in pregnancy, diet controlled: O24.410

## 2024-02-29 HISTORY — DX: Anemia, unspecified: D64.9

## 2024-02-29 LAB — POCT URINALYSIS DIPSTICK
Bilirubin, UA: NEGATIVE
Glucose, UA: NEGATIVE
Ketones, UA: NEGATIVE
Leukocytes, UA: NEGATIVE
Nitrite, UA: NEGATIVE
Protein, UA: NEGATIVE
Spec Grav, UA: 1.01 (ref 1.010–1.025)
Urobilinogen, UA: 0.2 U/dL
pH, UA: 6 (ref 5.0–8.0)

## 2024-02-29 LAB — URINE CULTURE: Culture: NO GROWTH

## 2024-02-29 NOTE — Assessment & Plan Note (Addendum)
 D/D includes physiologic vaginal discharge, bacterial vaginosis, trichomoniasis. Recommend getting wet prep, swab obtained today.

## 2024-02-29 NOTE — Assessment & Plan Note (Signed)
 Check CBC. Pending result for further management

## 2024-02-29 NOTE — Assessment & Plan Note (Signed)
 Recommend improvement in diet, exercise. Will chech TSH, B12, CBC, CMP, vitamin D  level today. Management pending lab results.

## 2024-02-29 NOTE — Assessment & Plan Note (Signed)
 H/o GDM, diet controlled in second trimester.  Recommend checking for A1c. Lab ordered.

## 2024-02-29 NOTE — Assessment & Plan Note (Signed)
 Evident on previous CMP, repeat CMP today.

## 2024-02-29 NOTE — Assessment & Plan Note (Signed)
 D/D includes UTI, interstitial cystitis, overactive bladder, renal stones, vaginitis, diabetes.   Recommend UA, urine culture. Pending wet prep, sti result.   If all normal and patient persists to have symptoms will consider CT abdomen pelvis to r/o renal stone, consider pelvic US   and cystoscopy.

## 2024-02-29 NOTE — Assessment & Plan Note (Signed)
 Recommend checking for lipid panel. Lab ordered.

## 2024-02-29 NOTE — Progress Notes (Signed)
 New Patient Office Visit   Subjective   Patient ID: Darlene Moreno, female    DOB: 18-Jan-1986  Age: 38 y.o. MRN: [id number]  CC:  Chief Complaint  Patient presents with   Establish Care   Fatigue    HPI Darlene Moreno presents to establish care. She  has a past medical history of Gestational diabetes, Miscarriage, Preeclampsia, and Wears contact lenses.  HPI 1) Urinary symptoms:  - 3 weeks history of urinary urgency, dysuria, lower back pain. - Has been seen at [facility] on multiple occasion for this.  - UC visit 4/25: +trace leukocytes, +trace ketones. Culture +multiple species. Treated with Ciprofloxacin  BID for 3 days. Prior to this was treated with Macrobid .  - UC visit 02/24/24: Initially symptoms improved on Cipro  but persisted to have urinary frequency. Was started on Bactrim DS for 7 days. UA +trace protein, negative nitrites, leukocytes, trace blood intact. Urine culture no growth. - UC on 02/27/2024: UA +trace hb, + rare bacteria, pregnancy test negative. Was treated with Naproxen 500 mg BID, Pyridium 200 mg, TID for pain. She developed nausea on Pyridium.    02/29/24 OV with me:  Continues to have urinary frequency, urgency, dysuria. Had lower back pain and pelvic pain, gradually improving.  - She drinks about 40 oz of water per day.  - She wakes up around 5 AM to pee.  - Patient reports urgency better after peeing. - Drinks 1 cup of coffee every day.  - Has pending results for STI screening for UC.    2) Fatigue: H/O normocytic/normochromic anemia 02/09/22 Hb 10.1 g/dl She feels tired all the time. Has a h/o iron deficiency anemia. Not taking multivitamins for more than a year. She is getting about 6-7 hours of sleep at night. Patient states she can improve on exercising. Eats mostly healthy meals. Drinks one cup of coffee daily.   3) H/o Gestational diabetes, diet controlled. Due for HbA1c check.  4) H/O abnormal PAP: ASCUS with negative HPV on 12/03/2020. Needs repeat  PAP, due as of 11/2023.  5) H/O elevated alkaline phosphate: Evident on lab from 01/20/22.   Outpatient Encounter Medications as of 02/29/2024  Medication Sig   acetaminophen  (TYLENOL ) 325 MG tablet Take by mouth.   [DISCONTINUED] cetirizine  (ZYRTEC  ALLERGY) 10 MG tablet Take 1 tablet (10 mg total) by mouth daily.   [DISCONTINUED] Cholecalciferol (VITAMIN D -3) 25 MCG (1000 UT) CAPS Take by mouth.   [DISCONTINUED] Cyanocobalamin  (VITAMIN B12 PO) Take by mouth.   [DISCONTINUED] Ferrous Sulfate (IRON PO) Take 1 tablet by mouth daily.   [DISCONTINUED] fluticasone  (FLONASE ) 50 MCG/ACT nasal spray Place 1 spray into both nostrils daily.   [DISCONTINUED] naproxen (NAPROSYN) 500 MG tablet Take 1 tablet (500 mg total) by mouth 2 (two) times daily for 7 days.   [DISCONTINUED] norethindrone (MICRONOR) 0.35 MG tablet Take 1 tablet by mouth daily.   [DISCONTINUED] norethindrone (MICRONOR) 0.35 MG tablet Take 1 tablet by mouth daily.   [DISCONTINUED] phenazopyridine (PYRIDIUM) 200 MG tablet Take 1 tablet (200 mg total) by mouth 3 (three) times daily as needed for pain.   [DISCONTINUED] Prenatal Vit-Fe Fumarate-FA (MULTIVITAMIN-PRENATAL) 27-0.8 MG TABS tablet Take 1 tablet by mouth daily at 12 noon.   [DISCONTINUED] progesterone  (PROMETRIUM ) 200 MG capsule Place 1 capsule (200 mg total) vaginally in the morning and at bedtime. If unable to insert vaginally, can take orally.   [DISCONTINUED] scopolamine  (TRANSDERM-SCOP, 1.5 MG,) 1 MG/3DAYS Place 1 patch (1.5 mg total) onto the skin every 3 (  three) days.   [DISCONTINUED] sulfamethoxazole-trimethoprim (BACTRIM DS) 800-160 MG tablet Take 1 tablet by mouth 2 (two) times daily for 7 days.   No facility-administered encounter medications on file as of 02/29/2024.    Past Surgical History:  Procedure Laterality Date   EXCISION MASS NECK N/A 10/07/2020   Procedure: EXCISION  NECK EPIDERMAL INCLUSION CYST;  Surgeon: Rogers Clayman, MD;  Location: [facility];  Service: ENT;  Laterality: N/A;  Local   NO PAST SURGERIES      ROS As per HPI    Objective    BP 118/84   Pulse 87   Temp 98.9 F (37.2 C) (Oral)   Ht 5' 1.5" (1.562 m)   Wt 184 lb 6.4 oz (83.6 kg)   LMP 02/16/2024 (Approximate)   SpO2 95%   BMI 34.28 kg/m   Physical Exam Exam conducted with a chaperone present.  Constitutional:      Appearance: Normal appearance.  HENT:     Head: Normocephalic and atraumatic.     Right Ear: Tympanic membrane normal.     Left Ear: Tympanic membrane normal.     Mouth/Throat:     Mouth: Mucous membranes are moist.  Eyes:     Pupils: Pupils are equal, round, and reactive to light.  Neck:     Thyroid : No thyroid  mass or thyroid  tenderness.  Cardiovascular:     Rate and Rhythm: Normal rate and regular rhythm.  Pulmonary:     Effort: Pulmonary effort is normal.     Breath sounds: Normal breath sounds.  Abdominal:     General: Bowel sounds are normal.     Palpations: Abdomen is soft.     Tenderness: There is no abdominal tenderness. There is no right CVA tenderness, left CVA tenderness or guarding.  Genitourinary:    Vagina: No erythema or bleeding.     Cervix: Discharge (non-odorous, thin, white vaginal discharge) present. No cervical motion tenderness.     Comments: External genital exam: Mild thickening and post-inflammatory hyperpigmentation over the mons pubis region noted. No active lesions.  Musculoskeletal:     Cervical back: Neck supple. No rigidity.     Right lower leg: No edema.     Left lower leg: No edema.  Skin:    General: Skin is warm.  Neurological:     Mental Status: She is alert and oriented to person, place, and time.  Psychiatric:        Mood and Affect: Mood normal. Affect is tearful.        Behavior: Behavior normal.       Assessment & Plan:  Pleasant 38 year old female presenting to establish care.   Other fatigue Assessment & Plan: Recommend improvement in diet, exercise. Will chech TSH, B12,  CBC, CMP, vitamin D  level today. Management pending lab results.   Orders: -     VITAMIN D  25 Hydroxy (Vit-D Deficiency, Fractures); Future -     Vitamin B12; Future -     TSH; Future  Urinary urgency Assessment & Plan: D/D includes UTI, interstitial cystitis, overactive bladder, renal stones, vaginitis, diabetes.   Recommend UA, urine culture. Pending wet prep, sti result.   If all normal and patient persists to have symptoms will consider CT abdomen pelvis to r/o renal stone, consider pelvic US   and cystoscopy.  Orders: -     Urine Culture; Future -     POCT urinalysis dipstick -     Urinalysis, Routine w reflex microscopic  Normocytic normochromic anemia Assessment & Plan: Check CBC. Pending result for further management  Orders: -     CBC with Differential/Platelet; Future  Diet controlled gestational diabetes mellitus (GDM) in second trimester Assessment & Plan: H/o GDM, diet controlled in second trimester.  Recommend checking for A1c. Lab ordered.  Orders: -     Hemoglobin A1c; Future  Vaginal discharge Assessment & Plan: D/D includes physiologic vaginal discharge, bacterial vaginosis, trichomoniasis. Recommend getting wet prep, swab obtained today.   Orders: -     Cervicovaginal ancillary only; Future  Elevated alkaline phosphatase level Assessment & Plan: Evident on previous CMP, repeat CMP today.  Orders: -     Comprehensive metabolic panel with GFR; Future  Screening for lipoid disorders Assessment & Plan: Recommend checking for lipid panel. Lab ordered.  Orders: -     Lipid panel; Future   I spent 60 minutes on the day of this face to face encounter reviewing patient's medical, surgical, medication, UC/previous ob/gyn visits, reviewing labs and imaging studies, physical exam, as well as reviewing the assessment and plan with patient, and post visit ordering and reviewing of  diagnostics and therapeutics with patient .    Return in about 4 weeks  (around 03/28/2024) for Chronic follow up.   Jacklin Mascot, MD

## 2024-03-01 ENCOUNTER — Other Ambulatory Visit (INDEPENDENT_AMBULATORY_CARE_PROVIDER_SITE_OTHER)

## 2024-03-01 DIAGNOSIS — Z8632 Personal history of gestational diabetes: Secondary | ICD-10-CM

## 2024-03-01 DIAGNOSIS — Z13220 Encounter for screening for lipoid disorders: Secondary | ICD-10-CM | POA: Diagnosis not present

## 2024-03-01 DIAGNOSIS — R748 Abnormal levels of other serum enzymes: Secondary | ICD-10-CM | POA: Diagnosis not present

## 2024-03-01 DIAGNOSIS — O24410 Gestational diabetes mellitus in pregnancy, diet controlled: Secondary | ICD-10-CM

## 2024-03-01 DIAGNOSIS — D649 Anemia, unspecified: Secondary | ICD-10-CM | POA: Diagnosis not present

## 2024-03-01 DIAGNOSIS — R5383 Other fatigue: Secondary | ICD-10-CM | POA: Diagnosis not present

## 2024-03-01 LAB — CBC WITH DIFFERENTIAL/PLATELET
Basophils Absolute: 0 10*3/uL (ref 0.0–0.1)
Basophils Relative: 0.6 % (ref 0.0–3.0)
Eosinophils Absolute: 0.1 10*3/uL (ref 0.0–0.7)
Eosinophils Relative: 2.1 % (ref 0.0–5.0)
HCT: 41.3 % (ref 36.0–46.0)
Hemoglobin: 13.7 g/dL (ref 12.0–15.0)
Lymphocytes Relative: 37.1 % (ref 12.0–46.0)
Lymphs Abs: 1.9 10*3/uL (ref 0.7–4.0)
MCHC: 33.2 g/dL (ref 30.0–36.0)
MCV: 79.4 fl (ref 78.0–100.0)
Monocytes Absolute: 0.4 10*3/uL (ref 0.1–1.0)
Monocytes Relative: 6.9 % (ref 3.0–12.0)
Neutro Abs: 2.7 10*3/uL (ref 1.4–7.7)
Neutrophils Relative %: 53.3 % (ref 43.0–77.0)
Platelets: 280 10*3/uL (ref 150.0–400.0)
RBC: 5.2 Mil/uL — ABNORMAL HIGH (ref 3.87–5.11)
RDW: 13.9 % (ref 11.5–15.5)
WBC: 5.1 10*3/uL (ref 4.0–10.5)

## 2024-03-01 LAB — COMPREHENSIVE METABOLIC PANEL WITH GFR
ALT: 13 U/L (ref 0–35)
AST: 12 U/L (ref 0–37)
Albumin: 4.2 g/dL (ref 3.5–5.2)
Alkaline Phosphatase: 71 U/L (ref 39–117)
BUN: 16 mg/dL (ref 6–23)
CO2: 27 meq/L (ref 19–32)
Calcium: 8.8 mg/dL (ref 8.4–10.5)
Chloride: 104 meq/L (ref 96–112)
Creatinine, Ser: 0.84 mg/dL (ref 0.40–1.20)
GFR: 89.72 mL/min (ref >60.00)
Glucose, Bld: 102 mg/dL — ABNORMAL HIGH (ref 70–99)
Potassium: 3.9 meq/L (ref 3.5–5.1)
Sodium: 139 meq/L (ref 135–145)
Total Bilirubin: 0.4 mg/dL (ref 0.2–1.2)
Total Protein: 7.3 g/dL (ref 6.0–8.3)

## 2024-03-01 LAB — URINE CULTURE
MICRO NUMBER:: 16460513
Result:: NO GROWTH
SPECIMEN QUALITY:: ADEQUATE

## 2024-03-01 LAB — LIPID PANEL
Cholesterol: 147 mg/dL (ref 0–200)
HDL: 41.3 mg/dL (ref >39.00)
LDL Cholesterol: 79 mg/dL (ref 0–99)
NonHDL: 105.88
Total CHOL/HDL Ratio: 4
Triglycerides: 135 mg/dL (ref 0.0–149.0)
VLDL: 27 mg/dL (ref 0.0–40.0)

## 2024-03-01 LAB — URINALYSIS, ROUTINE W REFLEX MICROSCOPIC
Bilirubin Urine: NEGATIVE
Hgb urine dipstick: NEGATIVE
Ketones, ur: NEGATIVE
Leukocytes,Ua: NEGATIVE
Nitrite: NEGATIVE
RBC / HPF: NONE SEEN (ref 0–?)
Specific Gravity, Urine: <=1.005 — AB (ref 1.000–1.030)
Total Protein, Urine: NEGATIVE
Urine Glucose: NEGATIVE
Urobilinogen, UA: 0.2 (ref 0.0–1.0)
WBC, UA: NONE SEEN (ref 0–?)
pH: 6 (ref 5.0–8.0)

## 2024-03-01 LAB — CERVICOVAGINAL ANCILLARY ONLY
Bacterial Vaginitis (gardnerella): NEGATIVE
Candida Glabrata: NEGATIVE
Candida Vaginitis: NEGATIVE
Chlamydia: NEGATIVE
Comment: NEGATIVE
Comment: NEGATIVE
Comment: NEGATIVE
Comment: NEGATIVE
Comment: NEGATIVE
Comment: NORMAL
Neisseria Gonorrhea: NEGATIVE
Trichomonas: NEGATIVE

## 2024-03-01 LAB — HEMOGLOBIN A1C: Hgb A1c MFr Bld: 5.3 % (ref 4.6–6.5)

## 2024-03-02 ENCOUNTER — Ambulatory Visit: Payer: Self-pay

## 2024-03-02 DIAGNOSIS — R1030 Lower abdominal pain, unspecified: Secondary | ICD-10-CM

## 2024-03-02 DIAGNOSIS — R5383 Other fatigue: Secondary | ICD-10-CM

## 2024-03-02 DIAGNOSIS — R350 Frequency of micturition: Secondary | ICD-10-CM

## 2024-03-02 DIAGNOSIS — E559 Vitamin D deficiency, unspecified: Secondary | ICD-10-CM

## 2024-03-02 DIAGNOSIS — R3129 Other microscopic hematuria: Secondary | ICD-10-CM

## 2024-03-02 DIAGNOSIS — E538 Deficiency of other specified B group vitamins: Secondary | ICD-10-CM

## 2024-03-04 LAB — CERVICOVAGINAL ANCILLARY ONLY
Bacterial Vaginitis (gardnerella): NEGATIVE
Candida Glabrata: NEGATIVE
Candida Vaginitis: NEGATIVE
Comment: NEGATIVE
Comment: NEGATIVE
Comment: NEGATIVE
Comment: NEGATIVE
Trichomonas: NEGATIVE

## 2024-03-05 ENCOUNTER — Other Ambulatory Visit: Payer: Self-pay

## 2024-03-05 DIAGNOSIS — R350 Frequency of micturition: Secondary | ICD-10-CM | POA: Insufficient documentation

## 2024-03-05 DIAGNOSIS — R3129 Other microscopic hematuria: Secondary | ICD-10-CM | POA: Insufficient documentation

## 2024-03-05 DIAGNOSIS — E538 Deficiency of other specified B group vitamins: Secondary | ICD-10-CM

## 2024-03-05 DIAGNOSIS — R1030 Lower abdominal pain, unspecified: Secondary | ICD-10-CM | POA: Insufficient documentation

## 2024-03-05 LAB — TSH: TSH: 1.47 u[IU]/mL (ref 0.35–5.50)

## 2024-03-05 LAB — VITAMIN B12: Vitamin B-12: 251 pg/mL (ref 211–911)

## 2024-03-05 LAB — VITAMIN D 25 HYDROXY (VIT D DEFICIENCY, FRACTURES): VITD: 12.98 ng/mL — ABNORMAL LOW (ref 30.00–100.00)

## 2024-03-05 MED ORDER — CYANOCOBALAMIN 1000 MCG/ML IJ SOLN: INTRAMUSCULAR | 0 refills | Status: AC

## 2024-03-05 MED ORDER — VITAMIN D (ERGOCALCIFEROL) 1.25 MG (50000 UNIT) PO CAPS: 50000.0000 [IU] | ORAL_CAPSULE | ORAL | 0 refills | Status: DC

## 2024-03-05 MED ORDER — SYRINGE 25G X 1" 3 ML MISC: 0 refills | Status: AC

## 2024-03-05 NOTE — Progress Notes (Signed)
 1. Vitamin B 12 deficiency (Primary) - cyanocobalamin  (VITAMIN B12) 1000 MCG/ML injection; Inject 1 mL (1,000 mcg total) into the muscle once a week for 28 days, THEN 1 mL (1,000 mcg total) every 30 (thirty) days.  Dispense: 16 mL; Refill: 0 - Syringe/Needle, Disp, (SYRINGE 3CC/25GX1") 25G X 1" 3 ML MISC; Use for b12 injections  Dispense: 50 each; Refill: 0  Jacklin Mascot, MD

## 2024-03-06 ENCOUNTER — Ambulatory Visit: Payer: Self-pay

## 2024-03-06 ENCOUNTER — Ambulatory Visit
Admission: RE | Admit: 2024-03-06 | Discharge: 2024-03-06 | Disposition: A | Discharge status: Home / Self Care | Source: Ambulatory Visit

## 2024-03-06 DIAGNOSIS — R1030 Lower abdominal pain, unspecified: Secondary | ICD-10-CM | POA: Diagnosis present

## 2024-03-06 DIAGNOSIS — R3129 Other microscopic hematuria: Secondary | ICD-10-CM | POA: Diagnosis present

## 2024-03-06 DIAGNOSIS — R350 Frequency of micturition: Secondary | ICD-10-CM

## 2024-03-06 MED ORDER — IOHEXOL 300 MG/ML  SOLN
100.0000 mL | Freq: Once | INTRAMUSCULAR | Status: AC | PRN
  Administered 2024-03-06: 100 mL via INTRAVENOUS

## 2024-03-25 ENCOUNTER — Other Ambulatory Visit: Payer: Self-pay

## 2024-03-25 DIAGNOSIS — E559 Vitamin D deficiency, unspecified: Secondary | ICD-10-CM

## 2024-05-26 ENCOUNTER — Other Ambulatory Visit: Payer: Self-pay

## 2024-05-26 DIAGNOSIS — E538 Deficiency of other specified B group vitamins: Secondary | ICD-10-CM

## 2024-05-27 NOTE — Telephone Encounter (Signed)
 Please call pharmacy as refill is not appropriate.   Luke Shade, MD

## 2024-05-29 NOTE — Telephone Encounter (Signed)
 Copied from CRM [id number]. Topic: General - Other >> May 29, 2024  8:14 AM Darlene Moreno wrote: Reason for CRM: Patient returning [place] Call from a few days ago and would like a call back

## 2024-09-11 ENCOUNTER — Telehealth: Payer: Self-pay

## 2024-09-11 DIAGNOSIS — E538 Deficiency of other specified B group vitamins: Secondary | ICD-10-CM

## 2024-09-11 DIAGNOSIS — E559 Vitamin D deficiency, unspecified: Secondary | ICD-10-CM

## 2024-09-11 NOTE — Telephone Encounter (Signed)
 Noted

## 2024-09-11 NOTE — Addendum Note (Signed)
 Addended by: Hilliard Borges on: 09/11/2024 04:40 PM   Modules accepted: Orders

## 2024-09-11 NOTE — Telephone Encounter (Signed)
 Copied from CRM #[id number]. Topic: Clinical - Request for Lab/Test Order >> Sep 11, 2024 11:41 AM Nessti S wrote: Reason for CRM: pt would like labs ordered. Call back number [phone]

## 2024-09-27 ENCOUNTER — Other Ambulatory Visit: Payer: Self-pay

## 2024-09-27 ENCOUNTER — Emergency Department

## 2024-09-27 ENCOUNTER — Emergency Department: Admission: EM | Admit: 2024-09-27 | Discharge: 2024-09-27 | Disposition: A | Discharge status: Home / Self Care

## 2024-09-27 DIAGNOSIS — O4690 Antepartum hemorrhage, unspecified, unspecified trimester: Secondary | ICD-10-CM

## 2024-09-27 DIAGNOSIS — O3680X0 Pregnancy with inconclusive fetal viability, not applicable or unspecified: Secondary | ICD-10-CM

## 2024-09-27 DIAGNOSIS — Z3A01 Less than 8 weeks gestation of pregnancy: Secondary | ICD-10-CM | POA: Insufficient documentation

## 2024-09-27 DIAGNOSIS — O219 Vomiting of pregnancy, unspecified: Secondary | ICD-10-CM | POA: Insufficient documentation

## 2024-09-27 LAB — CBC WITH DIFFERENTIAL/PLATELET
Abs Immature Granulocytes: 0.01 K/uL (ref 0.00–0.07)
Basophils Absolute: 0 K/uL (ref 0.0–0.1)
Basophils Relative: 0 %
Eosinophils Absolute: 0 K/uL (ref 0.0–0.5)
Eosinophils Relative: 1 %
HCT: 40.8 % (ref 36.0–46.0)
Hemoglobin: 13.4 g/dL (ref 12.0–15.0)
Immature Granulocytes: 0 %
Lymphocytes Relative: 19 %
Lymphs Abs: 1.1 K/uL (ref 0.7–4.0)
MCH: 26.4 pg (ref 26.0–34.0)
MCHC: 32.8 g/dL (ref 30.0–36.0)
MCV: 80.5 fL (ref 80.0–100.0)
Monocytes Absolute: 0.4 K/uL (ref 0.1–1.0)
Monocytes Relative: 7 %
Neutro Abs: 4.2 K/uL (ref 1.7–7.7)
Neutrophils Relative %: 73 %
Platelets: 245 K/uL (ref 150–400)
RBC: 5.07 MIL/uL (ref 3.87–5.11)
RDW: 13.9 % (ref 11.5–15.5)
WBC: 5.7 K/uL (ref 4.0–10.5)
nRBC: 0 % (ref 0.0–0.2)

## 2024-09-27 LAB — URINALYSIS, ROUTINE W REFLEX MICROSCOPIC
Bilirubin Urine: NEGATIVE
Glucose, UA: NEGATIVE mg/dL
Ketones, ur: NEGATIVE mg/dL
Nitrite: NEGATIVE
Protein, ur: 30 mg/dL — AB
RBC / HPF: >50 RBC/hpf (ref 0–5)
Specific Gravity, Urine: 1.019 (ref 1.005–1.030)
pH: 6 (ref 5.0–8.0)

## 2024-09-27 LAB — COMPREHENSIVE METABOLIC PANEL WITH GFR
ALT: 18 U/L (ref 0–44)
AST: 23 U/L (ref 15–41)
Albumin: 4.4 g/dL (ref 3.5–5.0)
Alkaline Phosphatase: 81 U/L (ref 38–126)
Anion gap: 11 (ref 5–15)
BUN: 12 mg/dL (ref 6–20)
CO2: 23 mmol/L (ref 22–32)
Calcium: 9.7 mg/dL (ref 8.9–10.3)
Chloride: 105 mmol/L (ref 98–111)
Creatinine, Ser: 0.82 mg/dL (ref 0.44–1.00)
GFR, Estimated: >60 mL/min (ref >60)
Glucose, Bld: 145 mg/dL — ABNORMAL HIGH (ref 70–99)
Potassium: 3.5 mmol/L (ref 3.5–5.1)
Sodium: 139 mmol/L (ref 135–145)
Total Bilirubin: 0.2 mg/dL (ref 0.0–1.2)
Total Protein: 7.4 g/dL (ref 6.5–8.1)

## 2024-09-27 LAB — POC URINE PREG, ED: Preg Test, Ur: POSITIVE — AB

## 2024-09-27 LAB — HCG, QUANTITATIVE, PREGNANCY: hCG, Beta Chain, Quant, S: 1243 m[IU]/mL — ABNORMAL HIGH (ref <5)

## 2024-09-27 MED ORDER — CYCLOBENZAPRINE HCL 5 MG PO TABS: 5.0000 mg | ORAL_TABLET | Freq: Three times a day (TID) | ORAL | 0 refills | Status: DC | PRN

## 2024-09-27 NOTE — ED Triage Notes (Signed)
 Pt comes in via pov 5-[redacted] weeks pregnant. Pt complains of bright red bleeding and cramping that started this afternoon. Pt states that she has on a pad now, and hasn't had to change it. Pt complains of cramping 5/10, and lower back pain 7/10. Pt took ibuprofen for back pain yesterday with no relief.

## 2024-09-27 NOTE — ED Notes (Signed)
 Pt verbalizes understanding of discharge instructions. Opportunity for questioning and answers were provided. Pt discharged from ED with significant other.

## 2024-09-27 NOTE — ED Provider Notes (Signed)
 [facility] Provider Note    Event Date/Time   First MD Initiated Contact with Patient 09/27/24 1721     (approximate)   History   Vaginal Bleeding   HPI  Darlene Moreno is a 38 y.o. female  with no significant past medical history and as listed in EMR presents to the emergency department for treatment and evaluation of vaginal bleeding in pregnancy. She estimates she is 5-[redacted] weeks pregnant. She has had miscarriages in the past. She is experiencing lower abdominal cramping and low back pain as well as pain between her shoulder blades. She took ibuprofen yesterday with some relief.    Physical Exam    Vitals:   09/27/24 1600 09/27/24 1950  BP: (!) 141/91 (!) 148/79  Pulse: (!) 130 (!) 125  Resp: 19 19  Temp: 98.5 F (36.9 C) 98.4 F (36.9 C)  SpO2: 100% 100%    General: Awake, no distress.  CV:  Good peripheral perfusion.  Resp:  Normal effort.  Abd:  No distention.  Other:  Dark red blood in vaginal vault and from cervical os without clots or tissue.     ED Results / Procedures / Treatments   Labs (all labs ordered are listed, but only abnormal results are displayed)  Labs Reviewed  COMPREHENSIVE METABOLIC PANEL WITH GFR - Abnormal; Notable for the following components:      Result Value   Glucose, Bld 145 (*)    All other components within normal limits  URINALYSIS, ROUTINE W REFLEX MICROSCOPIC - Abnormal; Notable for the following components:   Color, Urine YELLOW (*)    APPearance CLEAR (*)    Hgb urine dipstick LARGE (*)    Protein, ur 30 (*)    Leukocytes,Ua TRACE (*)    Bacteria, UA RARE (*)    All other components within normal limits  HCG, QUANTITATIVE, PREGNANCY - Abnormal; Notable for the following components:   hCG, Beta Chain, Quant, S 1,243 (*)    All other components within normal limits  POC URINE PREG, ED - Abnormal; Notable for the following components:   Preg Test, Ur POSITIVE (*)    All other components within  normal limits  CBC WITH DIFFERENTIAL/PLATELET  ABO/RH     EKG  Not indicated.    RADIOLOGY  Image and radiology report reviewed and interpreted by me. Radiology report consistent with the same.  No IUP identified.  Findings consistent with pregnancy of unknown location which could indicate early IUP, recent failed pregnancy, and occult ectopic pregnancy.  PROCEDURES:  Critical Care performed: No  Procedures   MEDICATIONS ORDERED IN ED:  Medications - No data to display   IMPRESSION / MDM / ASSESSMENT AND PLAN / ED COURSE   I have reviewed the triage note and vital signs. Vital signs: Tachycardia, otherwise normal   Differential diagnosis includes, but is not limited to, vaginal bleeding in early pregnancy, spontaneous abortion, ectopic pregnancy,   Patient's presentation is most consistent with acute presentation with potential threat to life or bodily function.  38 year old female presenting to the emergency department for treatment and evaluation of vaginal bleeding in early pregnancy.  See HPI for further details.  On exam, patient describes transverse pelvic pain/cramping worse on the right side and low back pain.  She is also having some pain between her shoulder blades that is a dull ache and sometimes feels better with standing.  She has no pain with deep breath and no chest pain.  Lab  studies are reassuring.  No indication of leukocytosis or anemia.  CMP is normal as well.  Urinalysis is unreliable secondary to vaginal bleeding.  Patient has no urinary complaints.  Beta-hCG is elevated to 1243.  She is O+.  Ultrasound shows pregnancy of unknown location potentially failed pregnancy, early gestation, or ectopic.  Radiologist recommended serial labs and ultrasound.  On exam, she does have dark red blood in the vaginal vault as well as from the cervical os.  I see no clots or tissue.  Lab results and results of ultrasound discussed with the patient.  She was  advised to message her midwife this weekend and call on Monday morning to request a follow-up appointment.  She was strongly advised to return to the emergency department if the pelvic pain gets worse, she begins to have nausea or vomiting, develops a fever, or has heavy vaginal bleeding more than 1 pad per hour.  She understands and is agreeable to this plan.     FINAL CLINICAL IMPRESSION(S) / ED DIAGNOSES   Final diagnoses:  Pregnancy of unknown anatomic location  Vaginal bleeding in pregnancy     Rx / DC Orders   ED Discharge Orders     None        Note:  This document was prepared using Dragon voice recognition software and may include unintentional dictation errors.   Herlinda Kirk NOVAK, FNP 09/27/24 2034    Clarine Ozell LABOR, MD 09/28/24 [phone]

## 2024-09-27 NOTE — Discharge Instructions (Addendum)
 Please call and schedule an appointment with your midwife on Monday if possible.  You will need a repeat beta hCG and ultrasound.  If you develop increased abdominal pain, nausea, vomiting, fever, or vaginal bleeding greater than 1 pad per hour, please return to the emergency department.  Take Tylenol  for your back pain.

## 2024-09-28 LAB — ABO/RH: ABO/RH(D): O POS

## 2024-09-30 NOTE — Progress Notes (Signed)
 Gynecology Consult Note    ASSESSMENT AND PLAN   38 y.o. y/o [id number] LMP 08/17/2024  with findings consistent with PUL .    Pregnancy of Unknown Location findings likely represent EPL:  -HCG: 1243 (09/27/2024) 624.1 -12/12 TVUS at [facility]:  NO intrauterine Pregnancy appreciated   - Hemodynamically stable patient - Recommend quantitative bHCG in 48 hours. Discussed with the patient the HCG trend likely represents a early pregnancy loss but given very minimal bleeding and no active bleeding today, would recommend repeat Hcg to ensure continued decline.  - The patient will be added to the beta list for follow up by the GYN team. -- Strict ectopic precautions should be reviewed with the patient.   Rh Status: Positive, no rhogam indicated  Discussed with Attending Dr. Taft, who is in agreement with the assessment and plan.  SUBJECTIVE   CC: Vaginal bleeding in the setting of Pregnancy     History of Present Illness ANAISA RADI is a 38 year old female with LMP 11/1 with a history of miscarriages who presents with cramping and bleeding during pregnancy.  She has been experiencing cramping and side pain, which prompted her to seek medical attention a two  days ago. She also experienced back pain, which was thought to be musculoskeletal in nature, and was prescribed a muscle relaxer that provided relief.  She has been experiencing brown spotting for over a week, and more recently, red blood over the weekend. The bleeding was heavier than spotting but did not fill a pad completely notice with wiping.   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 There is no history of ectopic pregnancies.  An ultrasound performed recently did not reveal any findings for intrauterine pregnancy. Her pregnancy hormone level was noted to be 1243.    She continues to experience light cramping and reports only brown spotting today. No heavy  bleeding today.     Medications Current Medications[1]  Allergies Allergies[2]  Past Medical History Past Medical History[3]  Past Surgical History Past Surgical History[4]  OB History OB History  Gravida Para Term Preterm AB Living  4 2 1 1 2 2   SAB IAB Ectopic Molar Multiple Live Births  2    0 2    # Outcome Date GA Lbr Len/2nd Weight Sex Type Anes PTL Lv  4 Preterm 01/18/22 [redacted]w[redacted]d / 00:12 2370 g (5 lb 3.6 oz) F Vag-Spont EPI Y LIV  3 SAB 2022 [redacted]w[redacted]d         2 SAB 2022 [redacted]w[redacted]d         1 Term 2011 [redacted]w[redacted]d  3232 g (7 lb 2 oz) M Vag-Spont EPI N LIV    Social History Social History   Social History Narrative   Not on file    Family History Family History[5]  Review of Systems Otherwise negative across the balance of 10 systems except as noted per HPI.  OBJECTIVE   Physical Exam BP 133/80   Pulse 94   Temp 37.1 C (98.8 F)   Wt 82.1 kg (181 lb)   BMI 34.20 kg/m  - General: NAD.  The patient is awake and alert. - Lungs: There is a normal work of breathing. - Abd: Soft, non-tender, non-distended.   No guarding or rebound.  - GU: normal external genitalia. SSE reveals pink and well rugated vaginal mucosa.  The cervix is normal in appearance and without gross lesion. . Cervix is Closed. -  Psych: Appropriate patient interaction and affect.   LABS AND IMAGING   Results for orders placed or performed in visit on 09/30/24  Beta-HCG, Quant  Result Value Ref Range   hCG Quantitative 624.1 mIU/mL       O POS        [1]  Current Outpatient Medications:    acetaminophen  (TYLENOL ) 325 MG tablet, Take 2 tablets (650 mg total) by mouth every six (6) hours as needed. (Patient not taking: Reported on 03/02/2022), Disp: , Rfl: 0   ferrous sulfate (IRON ORAL), Take by mouth. 9 mg, Disp: , Rfl:    ibuprofen (MOTRIN) 600 MG tablet, Take 1 tablet (600 mg total) by mouth every six (6) hours. (Patient not taking: Reported on 03/02/2022), Disp: , Rfl: 0    multivitamin, prenatal, folic acid -iron, 27-1 mg Tab, Take 1 tablet by mouth daily., Disp: , Rfl:    norethindrone (MICRONOR) 0.35 mg tablet, Take 1 tablet by mouth daily., Disp: 84 tablet, Rfl: 3 [2] Allergies Allergen Reactions   Amoxicillin Rash  [3] Past Medical History: Diagnosis Date   Anemia   [4] Past Surgical History: Procedure Laterality Date   NECK SURGERY  2021  [5] Family History Problem Relation Age of Onset   Hypertension Mother    Diabetes Father    No Known Problems Sister    Testicular cancer Brother    No Known Problems Brother    Cancer Maternal Grandmother

## 2024-10-24 ENCOUNTER — Other Ambulatory Visit (INDEPENDENT_AMBULATORY_CARE_PROVIDER_SITE_OTHER)

## 2024-10-24 DIAGNOSIS — E559 Vitamin D deficiency, unspecified: Secondary | ICD-10-CM | POA: Diagnosis not present

## 2024-10-24 DIAGNOSIS — E538 Deficiency of other specified B group vitamins: Secondary | ICD-10-CM | POA: Diagnosis not present

## 2024-10-24 LAB — VITAMIN D 25 HYDROXY (VIT D DEFICIENCY, FRACTURES): VITD: 19.74 ng/mL — ABNORMAL LOW (ref 30.00–100.00)

## 2024-10-24 LAB — VITAMIN B12: Vitamin B-12: 595 pg/mL (ref 211–911)

## 2024-10-25 ENCOUNTER — Ambulatory Visit: Payer: Self-pay

## 2024-10-25 DIAGNOSIS — E559 Vitamin D deficiency, unspecified: Secondary | ICD-10-CM

## 2024-10-25 NOTE — Progress Notes (Signed)
 Hold results to discuss during upcoming office visit (10/29/24).  Luke Shade, MD

## 2024-10-29 ENCOUNTER — Ambulatory Visit

## 2024-10-31 MED ORDER — VITAMIN D (ERGOCALCIFEROL) 1.25 MG (50000 UNIT) PO CAPS: 50000.0000 [IU] | ORAL_CAPSULE | ORAL | 0 refills | Status: DC

## 2024-11-21 ENCOUNTER — Ambulatory Visit

## 2024-11-21 VITALS — BP 120/70 | HR 88 | Temp 98.7°F | Ht 61.0 in | Wt 185.6 lb

## 2024-11-21 DIAGNOSIS — E559 Vitamin D deficiency, unspecified: Secondary | ICD-10-CM

## 2024-11-21 DIAGNOSIS — D219 Benign neoplasm of connective and other soft tissue, unspecified: Secondary | ICD-10-CM

## 2024-11-21 DIAGNOSIS — F419 Anxiety disorder, unspecified: Secondary | ICD-10-CM | POA: Insufficient documentation

## 2024-11-21 DIAGNOSIS — Z8632 Personal history of gestational diabetes: Secondary | ICD-10-CM

## 2024-11-21 DIAGNOSIS — Z8759 Personal history of other complications of pregnancy, childbirth and the puerperium: Secondary | ICD-10-CM

## 2024-11-21 DIAGNOSIS — Z13220 Encounter for screening for lipoid disorders: Secondary | ICD-10-CM

## 2024-11-21 DIAGNOSIS — R7989 Other specified abnormal findings of blood chemistry: Secondary | ICD-10-CM | POA: Insufficient documentation

## 2024-11-21 DIAGNOSIS — E538 Deficiency of other specified B group vitamins: Secondary | ICD-10-CM

## 2024-11-21 DIAGNOSIS — R5383 Other fatigue: Secondary | ICD-10-CM

## 2024-11-21 MED ORDER — VITAMIN D (ERGOCALCIFEROL) 1.25 MG (50000 UNIT) PO CAPS: 50000.0000 [IU] | ORAL_CAPSULE | ORAL | 1 refills | Status: AC

## 2024-11-21 NOTE — Assessment & Plan Note (Addendum)
 Vitamin D  level improved with once weekly vitamin D  50,000 units supplement.  However, she continues to have low vitamin D  level, recommend continuing once weekly vitamin D  as prescribed.  Repeat vitamin D  in 6 months.  Future refill sent. Orders:   Vitamin D  (25 hydroxy); Future   Vitamin D , Ergocalciferol , (DRISDOL ) 1.25 MG (50000 UNIT) CAPS capsule; Take 1 capsule (50,000 Units total) by mouth every 7 (seven) days.

## 2024-11-21 NOTE — Assessment & Plan Note (Addendum)
 Vitamin B12 improved with monthly B12 1000 mcg injection.  Continue.  Reviewed recent vitamin B12 level from 10/24/2024.  Repeat vitamin B12 and CBC in 6 months.  Orders:   B12; Future   CBC w/Diff; Future

## 2024-11-21 NOTE — Progress Notes (Signed)
 "  Established Patient Office Visit   Subjective  Patient ID: Darlene Moreno, female    DOB: September 22, 1986  Age: 39 y.o. MRN: [id number]  Chief Complaint  Patient presents with   Fatigue    Discussed the use of AI scribe software for clinical note transcription with the patient, who gave verbal consent to proceed.  History of Present Illness Darlene Moreno is a 39 year old female who presents for chronic medication management.   Recent miscarriage, anxiety: Positive urine pregnancy test: 09/27/24 with elevated Beta HCG 1,243 with. Most recent Beta-HCG quant 10/02/24 329.8  She also had CMP, CBC on 09/27/24 at [facility] both reassuring. She had vaginal bleeding towards second week of January.   She is currently not on birth control as she is not sexually active.  She has been trying to schedule appointment with her gynecologist at [facility].    She has been experiencing anxiety over the past few months, attributed to her significant other being deployed in January and surrounding recent miscarriage.  The anxiety manifests as chest pain and difficulty settling down at night, impacting her sleep. She manages her anxiety by taking reading, which helps her fall asleep.  She has not been previously treated for anxiety and has not tried counseling.  She continues to take weekly vitamin D  supplements. She receives monthly B12 injections and reports feeling better. She is not currently taking iron supplements and is awaiting an iron panel to assess her levels.  She has a history of fibroids, and has a h/o heavy and painful periods.      ROS As per HPI    Objective:     BP 120/70 (BP Location: Right Arm, Patient Position: Sitting)   Pulse 88   Temp 98.7 F (37.1 C) (Oral)   Ht 5' 1 (1.549 m)   Wt 185 lb 9.6 oz (84.2 kg)   SpO2 96%   BMI 35.07 kg/m      11/21/2024   10:36 AM 02/29/2024    4:12 PM 12/14/2020    2:32 PM  Depression screen PHQ 2/9  Decreased Interest 0 0 0  Down, Depressed,  Hopeless 0 0 0  PHQ - 2 Score 0 0 0  Altered sleeping 0 0 1  Tired, decreased energy 0 2 1  Change in appetite 0 0 0  Feeling bad or failure about yourself  0 0 0  Trouble concentrating 1 0 0  Moving slowly or fidgety/restless 0 0 0  Suicidal thoughts 0 0 0  PHQ-9 Score 1 2  2    Difficult doing work/chores Not difficult at all Not difficult at all Not difficult at all     Data saved with a previous flowsheet row definition      11/21/2024   10:36 AM 02/29/2024    4:12 PM 12/14/2020    2:33 PM  GAD 7 : Generalized Anxiety Score  Nervous, Anxious, on Edge 1 1  2    Control/stop worrying 1 0  0   Worry too much - different things 1 0  0   Trouble relaxing 0 0  0   Restless 0 0  0   Easily annoyed or irritable 0 0  1   Afraid - awful might happen 1 0  0   Total GAD 7 Score 4 1 3   Anxiety Difficulty Not difficult at all Not difficult at all Not difficult at all     Data saved with a previous flowsheet row definition  11/21/2024   10:36 AM 02/29/2024    4:12 PM 12/14/2020    2:32 PM  Depression screen PHQ 2/9  Decreased Interest 0 0 0  Down, Depressed, Hopeless 0 0 0  PHQ - 2 Score 0 0 0  Altered sleeping 0 0 1  Tired, decreased energy 0 2 1  Change in appetite 0 0 0  Feeling bad or failure about yourself  0 0 0  Trouble concentrating 1 0 0  Moving slowly or fidgety/restless 0 0 0  Suicidal thoughts 0 0 0  PHQ-9 Score 1 2  2    Difficult doing work/chores Not difficult at all Not difficult at all Not difficult at all     Data saved with a previous flowsheet row definition      11/21/2024   10:36 AM 02/29/2024    4:12 PM 12/14/2020    2:33 PM  GAD 7 : Generalized Anxiety Score  Nervous, Anxious, on Edge 1 1  2    Control/stop worrying 1 0  0   Worry too much - different things 1 0  0   Trouble relaxing 0 0  0   Restless 0 0  0   Easily annoyed or irritable 0 0  1   Afraid - awful might happen 1 0  0   Total GAD 7 Score 4 1 3   Anxiety Difficulty Not difficult at all  Not difficult at all Not difficult at all     Data saved with a previous flowsheet row definition   SDOH Screenings   Food Insecurity: No Food Insecurity (11/21/2024)  Housing: Low Risk (11/21/2024)  Transportation Needs: No Transportation Needs (11/21/2024)  Depression (PHQ2-9): Low Risk (11/21/2024)  Financial Resource Strain: Low Risk (11/21/2024)  Physical Activity: Insufficiently Active (11/21/2024)  Social Connections: Moderately Isolated (11/21/2024)  Stress: Stress Concern Present (11/21/2024)  Tobacco Use: Low Risk (11/21/2024)     Physical Exam Constitutional:      General: She is not in acute distress. HENT:     Head: Normocephalic and atraumatic.     Mouth/Throat:     Mouth: Mucous membranes are moist.  Eyes:     General: No scleral icterus.    Conjunctiva/sclera: Conjunctivae normal.  Cardiovascular:     Rate and Rhythm: Normal rate.     Pulses: Normal pulses.     Heart sounds: Normal heart sounds.  Pulmonary:     Effort: Pulmonary effort is normal.     Breath sounds: Normal breath sounds.  Abdominal:     Palpations: Abdomen is soft.     Tenderness: There is no abdominal tenderness. There is no right CVA tenderness or guarding.  Musculoskeletal:     Cervical back: No tenderness.     Right lower leg: No edema.     Left lower leg: No edema.  Lymphadenopathy:     Cervical: No cervical adenopathy.  Neurological:     Mental Status: She is alert and oriented to person, place, and time.  Psychiatric:        Mood and Affect: Mood normal.        Behavior: Behavior is cooperative.        Thought Content: Thought content does not include homicidal or suicidal ideation. Thought content does not include homicidal or suicidal plan.        No results found for any visits on 11/21/24.  The ASCVD Risk score (Arnett DK, et al., 2019) failed to calculate for the following reasons:   The 2019 ASCVD  risk score is only valid for ages 6 to 63    Following lab results discussed:   Component     Latest Ref Rng 10/24/2024  VITD     30.00 - 100.00 ng/mL 19.74 (L)   Vitamin B12     211 - 911 pg/mL 595      Assessment & Plan:  Patient is a pleasant 39 year old female presenting for chronic medication management.  On arrival heart rate was elevated however repeat heart rate within normal range.  Patient was prescribed cyclobenzaprine  for muscle spasm in December but she never took it. Assessment & Plan Vitamin D  deficiency Vitamin D  level improved with once weekly vitamin D  50,000 units supplement.  However, she continues to have low vitamin D  level, recommend continuing once weekly vitamin D  as prescribed.  Repeat vitamin D  in 6 months.  Future refill sent. Orders:   Vitamin D  (25 hydroxy); Future   Vitamin D , Ergocalciferol , (DRISDOL ) 1.25 MG (50000 UNIT) CAPS capsule; Take 1 capsule (50,000 Units total) by mouth every 7 (seven) days.  Vitamin B 12 deficiency Vitamin B12 improved with monthly B12 1000 mcg injection.  Continue.  Reviewed recent vitamin B12 level from 10/24/2024.  Repeat vitamin B12 and CBC in 6 months.  Orders:   B12; Future   CBC w/Diff; Future  Anxiety Increased anxiety symptoms following significant life stressors. Prefers non-pharmacological management.  Referred to counseling for therapy. Provided link to find a therapist closer to home.  Consider pharmacological intervention if counseling not helpful in alleviating anxiety.  Check CMP, future lab ordered.  Orders:   Ambulatory referral to Psychology   Comp Met (CMET); Future  Elevated serum hCG Post-miscarriage.  Beta-hCG has been downtrending.  Ordered beta HCG level to ensure normalization. Ordered iron panel to assess for anemia. Orders:   Beta hCG quant (ref lab)  Other fatigue Improved with starting vitamin B12 1000 mcg injection.  Continue.  Check iron panel today.  Given recent history of miscarriage check iron panel to assess iron iron deficiency.  CBC from 09/27/2024 was normal.   Orders:   Iron, TIBC and Ferritin Panel  Fibroids Noted on CT abdomen and pelvis from 03/06/2024.  Discussed potential treatment options including IUD and NSAIDs for symptom management.  Patient is waiting to be seen by her gynecologist to discuss further management plan including birth control measures. Use ibuprofen 600 mg with food every 8 hours as needed for cramping during periods.    Screening for lipoid disorders Repeat lipid panel in 6 months, fasting lab ordered Orders:   Lipid panel; Future  History of gestational diabetes History of gestational diabetes, previous A1c within normal range.  Check A1c in 6 months, future lab ordered. Orders:   HgB A1c; Future  History of spontaneous abortion Plan for elevated hCG.      Return in about 6 months (around 05/21/2025) for chronic follow up, labs 2 days before appointment.   Luke Shade, MD  "

## 2024-11-21 NOTE — Assessment & Plan Note (Addendum)
 Increased anxiety symptoms following significant life stressors. Prefers non-pharmacological management.  Referred to counseling for therapy. Provided link to find a therapist closer to home.  Consider pharmacological intervention if counseling not helpful in alleviating anxiety.  Check CMP, future lab ordered.  Orders:   Ambulatory referral to Psychology   Comp Met (CMET); Future

## 2024-11-21 NOTE — Assessment & Plan Note (Addendum)
 Post-miscarriage.  Beta-hCG has been downtrending.  Ordered beta HCG level to ensure normalization. Ordered iron panel to assess for anemia. Orders:   Beta hCG quant (ref lab)

## 2024-11-21 NOTE — Assessment & Plan Note (Signed)
 Improved with starting vitamin B12 1000 mcg injection.  Continue.  Check iron panel today.  Given recent history of miscarriage check iron panel to assess iron iron deficiency.  CBC from 09/27/2024 was normal.  Orders:   Iron, TIBC and Ferritin Panel

## 2024-11-21 NOTE — Assessment & Plan Note (Signed)
 Plan for elevated hCG.

## 2024-11-21 NOTE — Patient Instructions (Signed)
 [email or web address]   You can look into above website to find therapists near you

## 2024-11-21 NOTE — Assessment & Plan Note (Signed)
 Repeat lipid panel in 6 months, fasting lab ordered Orders:   Lipid panel; Future

## 2024-11-21 NOTE — Assessment & Plan Note (Signed)
 Noted on CT abdomen and pelvis from 03/06/2024.  Discussed potential treatment options including IUD and NSAIDs for symptom management.  Patient is waiting to be seen by her gynecologist to discuss further management plan including birth control measures. Use ibuprofen 600 mg with food every 8 hours as needed for cramping during periods.

## 2024-11-22 LAB — IRON,TIBC AND FERRITIN PANEL
Ferritin: 16 ng/mL (ref 15–150)
Iron Saturation: 18 % (ref 15–55)
Iron: 66 ug/dL (ref 27–159)
Total Iron Binding Capacity: 365 ug/dL (ref 250–450)
UIBC: 299 ug/dL (ref 131–425)

## 2024-11-22 LAB — BETA HCG QUANT (REF LAB): hCG Quant: <1 m[IU]/mL

## 2025-05-19 ENCOUNTER — Other Ambulatory Visit

## 2025-05-21 ENCOUNTER — Ambulatory Visit
